# Patient Record
Sex: Male | Born: 1945 | Race: White | Hispanic: No | Marital: Married | Smoking: Never smoker
Health system: Southern US, Community
[De-identification: ages and names within clinical notes are randomized; demographics above are authoritative.]

## PROBLEM LIST (undated history)

## (undated) DIAGNOSIS — I82409 Acute embolism and thrombosis of unspecified deep veins of unspecified lower extremity: Secondary | ICD-10-CM

## (undated) DIAGNOSIS — D6869 Other thrombophilia: Secondary | ICD-10-CM

## (undated) DIAGNOSIS — I10 Essential (primary) hypertension: Secondary | ICD-10-CM

## (undated) DIAGNOSIS — J40 Bronchitis, not specified as acute or chronic: Secondary | ICD-10-CM

## (undated) DIAGNOSIS — G629 Polyneuropathy, unspecified: Secondary | ICD-10-CM

## (undated) DIAGNOSIS — I319 Disease of pericardium, unspecified: Secondary | ICD-10-CM

## (undated) DIAGNOSIS — R600 Localized edema: Secondary | ICD-10-CM

## (undated) DIAGNOSIS — G459 Transient cerebral ischemic attack, unspecified: Secondary | ICD-10-CM

## (undated) DIAGNOSIS — K259 Gastric ulcer, unspecified as acute or chronic, without hemorrhage or perforation: Secondary | ICD-10-CM

## (undated) DIAGNOSIS — E119 Type 2 diabetes mellitus without complications: Secondary | ICD-10-CM

## (undated) DIAGNOSIS — I7121 Aneurysm of the ascending aorta, without rupture: Secondary | ICD-10-CM

## (undated) DIAGNOSIS — I639 Cerebral infarction, unspecified: Secondary | ICD-10-CM

## (undated) DIAGNOSIS — K219 Gastro-esophageal reflux disease without esophagitis: Secondary | ICD-10-CM

## (undated) DIAGNOSIS — H9190 Unspecified hearing loss, unspecified ear: Secondary | ICD-10-CM

## (undated) DIAGNOSIS — E785 Hyperlipidemia, unspecified: Secondary | ICD-10-CM

## (undated) DIAGNOSIS — Z8719 Personal history of other diseases of the digestive system: Secondary | ICD-10-CM

## (undated) DIAGNOSIS — N400 Enlarged prostate without lower urinary tract symptoms: Secondary | ICD-10-CM

## (undated) HISTORY — PX: OTHER SURGICAL HISTORY: SHX169

## (undated) HISTORY — PX: KNEE ARTHROSCOPY: SHX127

## (undated) HISTORY — PX: CARDIAC CATHETERIZATION: SHX172

---

## 1987-08-30 DIAGNOSIS — I319 Disease of pericardium, unspecified: Secondary | ICD-10-CM

## 1987-08-30 HISTORY — DX: Disease of pericardium, unspecified: I31.9

## 2016-06-22 ENCOUNTER — Encounter: Payer: Self-pay | Admitting: *Deleted

## 2016-06-28 ENCOUNTER — Ambulatory Visit: Payer: Medicare Other | Admitting: Certified Registered Nurse Anesthetist

## 2016-06-28 ENCOUNTER — Encounter
Admission: RE | Disposition: A | Discharge status: Home / Self Care | Payer: Self-pay | Source: Ambulatory Visit | Attending: Ophthalmology

## 2016-06-28 ENCOUNTER — Ambulatory Visit
Admission: RE | Admit: 2016-06-28 | Discharge: 2016-06-28 | Disposition: A | Discharge status: Home / Self Care | Payer: Medicare Other | Source: Ambulatory Visit | Attending: Ophthalmology | Admitting: Ophthalmology

## 2016-06-28 DIAGNOSIS — E7800 Pure hypercholesterolemia, unspecified: Secondary | ICD-10-CM | POA: Insufficient documentation

## 2016-06-28 DIAGNOSIS — E1136 Type 2 diabetes mellitus with diabetic cataract: Secondary | ICD-10-CM | POA: Insufficient documentation

## 2016-06-28 DIAGNOSIS — Z79899 Other long term (current) drug therapy: Secondary | ICD-10-CM | POA: Diagnosis not present

## 2016-06-28 DIAGNOSIS — Z8673 Personal history of transient ischemic attack (TIA), and cerebral infarction without residual deficits: Secondary | ICD-10-CM | POA: Insufficient documentation

## 2016-06-28 DIAGNOSIS — K219 Gastro-esophageal reflux disease without esophagitis: Secondary | ICD-10-CM | POA: Insufficient documentation

## 2016-06-28 DIAGNOSIS — I10 Essential (primary) hypertension: Secondary | ICD-10-CM | POA: Insufficient documentation

## 2016-06-28 HISTORY — DX: Essential (primary) hypertension: I10

## 2016-06-28 HISTORY — PX: CATARACT EXTRACTION W/PHACO: SHX586

## 2016-06-28 HISTORY — DX: Cerebral infarction, unspecified: I63.9

## 2016-06-28 HISTORY — DX: Acute embolism and thrombosis of unspecified deep veins of unspecified lower extremity: I82.409

## 2016-06-28 HISTORY — DX: Type 2 diabetes mellitus without complications: E11.9

## 2016-06-28 HISTORY — DX: Unspecified hearing loss, unspecified ear: H91.90

## 2016-06-28 HISTORY — DX: Polyneuropathy, unspecified: G62.9

## 2016-06-28 HISTORY — DX: Benign prostatic hyperplasia without lower urinary tract symptoms: N40.0

## 2016-06-28 HISTORY — DX: Disease of pericardium, unspecified: I31.9

## 2016-06-28 HISTORY — DX: Gastro-esophageal reflux disease without esophagitis: K21.9

## 2016-06-28 LAB — GLUCOSE, CAPILLARY: Glucose-Capillary: 107 mg/dL — ABNORMAL HIGH (ref 65–99)

## 2016-06-28 SURGERY — PHACOEMULSIFICATION, CATARACT, WITH IOL INSERTION
Anesthesia: Monitor Anesthesia Care | Site: Eye | Laterality: Right | Wound class: Clean

## 2016-06-28 MED ORDER — NA CHONDROIT SULF-NA HYALURON 40-17 MG/ML IO SOLN
INTRAOCULAR | Status: DC | PRN
  Administered 2016-06-28: 1 mL via INTRAOCULAR

## 2016-06-28 MED ORDER — LIDOCAINE HCL (PF) 4 % IJ SOLN
INTRAMUSCULAR | Status: AC
  Filled 2016-06-28: qty 5

## 2016-06-28 MED ORDER — NA CHONDROIT SULF-NA HYALURON 40-17 MG/ML IO SOLN
INTRAOCULAR | Status: AC
  Filled 2016-06-28: qty 1

## 2016-06-28 MED ORDER — ARMC OPHTHALMIC DILATING DROPS
1.0000 "application " | OPHTHALMIC | Status: AC
  Administered 2016-06-28 (×3): 1 via OPHTHALMIC

## 2016-06-28 MED ORDER — POVIDONE-IODINE 5 % OP SOLN
OPHTHALMIC | Status: AC
  Filled 2016-06-28: qty 30

## 2016-06-28 MED ORDER — EPINEPHRINE PF 1 MG/ML IJ SOLN
INTRAMUSCULAR | Status: DC | PRN
  Administered 2016-06-28: 1 mL via OPHTHALMIC

## 2016-06-28 MED ORDER — ARMC OPHTHALMIC DILATING DROPS
OPHTHALMIC | Status: AC
  Administered 2016-06-28: 1 via OPHTHALMIC
  Filled 2016-06-28: qty 0.4

## 2016-06-28 MED ORDER — POVIDONE-IODINE 5 % OP SOLN
OPHTHALMIC | Status: DC | PRN
  Administered 2016-06-28: 1 via OPHTHALMIC

## 2016-06-28 MED ORDER — MOXIFLOXACIN HCL 0.5 % OP SOLN
OPHTHALMIC | Status: DC | PRN
  Administered 2016-06-28: 1 [drp] via OPHTHALMIC

## 2016-06-28 MED ORDER — MIDAZOLAM HCL 2 MG/2ML IJ SOLN
INTRAMUSCULAR | Status: DC | PRN
  Administered 2016-06-28: 1 mg via INTRAVENOUS

## 2016-06-28 MED ORDER — LIDOCAINE HCL (PF) 4 % IJ SOLN
INTRAOCULAR | Status: DC | PRN
  Administered 2016-06-28: 4 mL via OPHTHALMIC

## 2016-06-28 MED ORDER — SODIUM CHLORIDE 0.9 % IV SOLN
INTRAVENOUS | Status: DC
  Administered 2016-06-28: 09:00:00 via INTRAVENOUS

## 2016-06-28 MED ORDER — EPINEPHRINE PF 1 MG/ML IJ SOLN
INTRAMUSCULAR | Status: AC
  Filled 2016-06-28: qty 2

## 2016-06-28 MED ORDER — MOXIFLOXACIN HCL 0.5 % OP SOLN
OPHTHALMIC | Status: AC
  Administered 2016-06-28: 1 [drp] via OPHTHALMIC
  Filled 2016-06-28: qty 3

## 2016-06-28 MED ORDER — CARBACHOL 0.01 % IO SOLN
INTRAOCULAR | Status: DC | PRN
  Administered 2016-06-28: 0.5 mL via INTRAOCULAR

## 2016-06-28 MED ORDER — MOXIFLOXACIN HCL 0.5 % OP SOLN
1.0000 [drp] | OPHTHALMIC | Status: AC
  Administered 2016-06-28 (×3): 1 [drp] via OPHTHALMIC

## 2016-06-28 SURGICAL SUPPLY — 21 items
CANNULA ANT/CHMB 27GA (MISCELLANEOUS) ×3 IMPLANT
CUP MEDICINE 2OZ PLAST GRAD ST (MISCELLANEOUS) ×3 IMPLANT
GLOVE BIO SURGEON STRL SZ8 (GLOVE) ×3 IMPLANT
GLOVE BIOGEL M 6.5 STRL (GLOVE) ×3 IMPLANT
GLOVE SURG LX 8.0 MICRO (GLOVE) ×2
GLOVE SURG LX STRL 8.0 MICRO (GLOVE) ×1 IMPLANT
GOWN STRL REUS W/ TWL LRG LVL3 (GOWN DISPOSABLE) ×2 IMPLANT
GOWN STRL REUS W/TWL LRG LVL3 (GOWN DISPOSABLE) ×4
LENS IOL TECNIS ITEC 18.0 (Intraocular Lens) ×3 IMPLANT
PACK CATARACT (MISCELLANEOUS) ×3 IMPLANT
PACK CATARACT BRASINGTON LX (MISCELLANEOUS) ×3 IMPLANT
PACK EYE AFTER SURG (MISCELLANEOUS) ×3 IMPLANT
SOL BSS BAG (MISCELLANEOUS) ×3
SOL PREP PVP 2OZ (MISCELLANEOUS) ×3
SOLUTION BSS BAG (MISCELLANEOUS) ×1 IMPLANT
SOLUTION PREP PVP 2OZ (MISCELLANEOUS) ×1 IMPLANT
SYR 3ML LL SCALE MARK (SYRINGE) ×3 IMPLANT
SYR 5ML LL (SYRINGE) ×3 IMPLANT
SYR TB 1ML 27GX1/2 LL (SYRINGE) ×3 IMPLANT
WATER STERILE IRR 250ML POUR (IV SOLUTION) ×3 IMPLANT
WIPE NON LINTING 3.25X3.25 (MISCELLANEOUS) ×3 IMPLANT

## 2016-06-28 NOTE — Transfer of Care (Signed)
Immediate Anesthesia Transfer of Care Note  Patient: Yechiel Myklebust  Procedure(s) Performed: Procedure(s) with comments: CATARACT EXTRACTION PHACO AND INTRAOCULAR LENS PLACEMENT (IOC) (Right) - [place] 1.02 AP% 21.7 CDE 13.51 Fluid pack lot # [id number]  Patient Location: PACU  Anesthesia Type:MAC  Level of Consciousness: awake, alert  and oriented  Airway & Oxygen Therapy: Patient Spontanous Breathing  Post-op Assessment: Report given to RN and Post -op Vital signs reviewed and stable  Post vital signs: Reviewed and stable  Last Vitals:  Vitals:   06/28/16 0817  BP: (!) 153/96  Pulse: 66  Resp: 16  Temp: (!) 35.9 C    Last Pain:  Vitals:   06/28/16 0817  TempSrc: Tympanic         Complications: No apparent anesthesia complications

## 2016-06-28 NOTE — Anesthesia Postprocedure Evaluation (Signed)
Anesthesia Post Note  Patient: Ronald Duarte  Procedure(s) Performed: Procedure(s) (LRB): CATARACT EXTRACTION PHACO AND INTRAOCULAR LENS PLACEMENT (IOC) (Right)  Patient location during evaluation: PACU Anesthesia Type: MAC Level of consciousness: oriented and awake and alert Pain management: satisfactory to patient Vital Signs Assessment: post-procedure vital signs reviewed and stable Respiratory status: respiratory function stable Cardiovascular status: stable Anesthetic complications: no    Last Vitals:  Vitals:   06/28/16 0817 06/28/16 0944  BP: (!) 153/96 136/84  Pulse: 66 60  Resp: 16 16  Temp: (!) 35.9 C 36.5 C    Last Pain:  Vitals:   06/28/16 0944  TempSrc: Temporal                 Blima Singer

## 2016-06-28 NOTE — Anesthesia Procedure Notes (Signed)
Procedure Name: MAC Performed by: Milen Lengacher Pre-anesthesia Checklist: Patient identified, Emergency Drugs available, Suction available, Patient being monitored and Timeout performed Oxygen Delivery Method: Nasal cannula       

## 2016-06-28 NOTE — Anesthesia Procedure Notes (Deleted)
Anesthesia Regional Block: Narrative:       

## 2016-06-28 NOTE — Op Note (Signed)
PREOPERATIVE DIAGNOSIS:  Nuclear sclerotic cataract of the right eye.   POSTOPERATIVE DIAGNOSIS:  NUCLEAR SCLEROTIC CATARACT RIGHT EYE   OPERATIVE PROCEDURE: Procedure(s): CATARACT EXTRACTION PHACO AND INTRAOCULAR LENS PLACEMENT (IOC)   SURGEON:  Birder Robson, MD.   ANESTHESIA:  No anesthesia staff entered.  1.      Managed anesthesia care. 2.      0.80ml of Shugarcaine was instilled in the eye following the paracentesis.   COMPLICATIONS:  None.   TECHNIQUE:   Stop and chop   DESCRIPTION OF PROCEDURE:  The patient was examined and consented in the preoperative holding area where the aforementioned topical anesthesia was applied to the right eye and then brought back to the Operating Room where the right eye was prepped and draped in the usual sterile ophthalmic fashion and a lid speculum was placed. A paracentesis was created with the side port blade and the anterior chamber was filled with viscoelastic. A near clear corneal incision was performed with the steel keratome. A continuous curvilinear capsulorrhexis was performed with a cystotome followed by the capsulorrhexis forceps. Hydrodissection and hydrodelineation were carried out with BSS on a blunt cannula. The lens was removed in a stop and chop  technique and the remaining cortical material was removed with the irrigation-aspiration handpiece. The capsular bag was inflated with viscoelastic and the Technis [id number]  lens was placed in the capsular bag without complication. The remaining viscoelastic was removed from the eye with the irrigation-aspiration handpiece. The wounds were hydrated. The anterior chamber was flushed with Miostat and the eye was inflated to physiologic pressure. 0.23ml of Vigamox was placed in the anterior chamber. The wounds were found to be water tight. The eye was dressed with Vigamox. The patient was given protective glasses to wear throughout the day and a shield with which to sleep tonight. The patient was also  given drops with which to begin a drop regimen today and will follow-up with me in one day.  Implant Name Type Inv. Item Serial No. Manufacturer Lot No. LRB No. Used  LENS IOL DIOP 18.0 - [id number] Intraocular Lens LENS IOL DIOP 18.0 [id number]   Right 1   Procedure(s) with comments: CATARACT EXTRACTION PHACO AND INTRAOCULAR LENS PLACEMENT (IOC) (Right) - [place] 1.02 AP% 21.7 CDE 13.51 Fluid pack lot # [id number]  Electronically signed: [place] 06/28/2016 9:42 AM

## 2016-06-28 NOTE — Anesthesia Preprocedure Evaluation (Signed)
Anesthesia Evaluation  Patient identified by MRN, date of birth, ID band Patient awake    Reviewed: Allergy & Precautions, H&P , NPO status , Patient's Chart, lab work & pertinent test results, reviewed documented beta blocker date and time   Airway Mallampati: II  TM Distance: >3 FB Neck ROM: full    Dental no notable dental hx. (+) Teeth Intact   Pulmonary neg pulmonary ROS,    Pulmonary exam normal breath sounds clear to auscultation       Cardiovascular Exercise Tolerance: Good hypertension, negative cardio ROS   Rhythm:regular Rate:Normal     Neuro/Psych CVA, No Residual Symptoms negative neurological ROS  negative psych ROS   GI/Hepatic negative GI ROS, Neg liver ROS, GERD  Medicated,  Endo/Other  negative endocrine ROSdiabetes  Renal/GU      Musculoskeletal   Abdominal   Peds  Hematology negative hematology ROS (+)   Anesthesia Other Findings   Reproductive/Obstetrics negative OB ROS                             Anesthesia Physical Anesthesia Plan  ASA: III  Anesthesia Plan: MAC   Post-op Pain Management:    Induction:   Airway Management Planned:   Additional Equipment:   Intra-op Plan:   Post-operative Plan:   Informed Consent: I have reviewed the patients History and Physical, chart, labs and discussed the procedure including the risks, benefits and alternatives for the proposed anesthesia with the patient or authorized representative who has indicated his/her understanding and acceptance.     Plan Discussed with: CRNA  Anesthesia Plan Comments:         Anesthesia Quick Evaluation

## 2016-06-28 NOTE — Discharge Instructions (Signed)
Eye Surgery Discharge Instructions  Expect mild scratchy sensation or mild soreness. DO NOT RUB YOUR EYE!  The day of surgery:  Minimal physical activity, but bed rest is not required  No reading, computer work, or close hand work  No bending, lifting, or straining.  May watch TV  For 24 hours:  No driving, legal decisions, or alcoholic beverages  Safety precautions  Eat anything you prefer: It is better to start with liquids, then soup then solid foods.  _____ Eye patch should be worn until postoperative exam tomorrow.  ____ Solar shield eyeglasses should be worn for comfort in the sunlight/patch while sleeping  Resume all regular medications including aspirin or Coumadin if these were discontinued prior to surgery. You may shower, bathe, shave, or wash your hair. Tylenol may be taken for mild discomfort.  Call your doctor if you experience significant pain, nausea, or vomiting, fever > 101 or other signs of infection. [phone] or [phone] Specific instructions:  Follow-up Information    Tim Lair, MD .   Specialty:  Ophthalmology Why:  November 1 at 9:40am Contact information: [address] [place] [address] [phone]

## 2016-06-28 NOTE — H&P (Signed)
All labs reviewed. Abnormal studies sent to patients PCP when indicated.  Previous H&P reviewed, patient examined, there are NO CHANGES.  Ronald Freiermuth LOUIS10/31/20179:17 AM

## 2016-08-23 DIAGNOSIS — E119 Type 2 diabetes mellitus without complications: Secondary | ICD-10-CM | POA: Insufficient documentation

## 2016-08-23 DIAGNOSIS — K219 Gastro-esophageal reflux disease without esophagitis: Secondary | ICD-10-CM | POA: Insufficient documentation

## 2016-08-23 DIAGNOSIS — Z86718 Personal history of other venous thrombosis and embolism: Secondary | ICD-10-CM | POA: Insufficient documentation

## 2016-08-23 DIAGNOSIS — Z8679 Personal history of other diseases of the circulatory system: Secondary | ICD-10-CM | POA: Insufficient documentation

## 2016-08-23 DIAGNOSIS — R399 Unspecified symptoms and signs involving the genitourinary system: Secondary | ICD-10-CM | POA: Insufficient documentation

## 2016-08-23 DIAGNOSIS — I2510 Atherosclerotic heart disease of native coronary artery without angina pectoris: Secondary | ICD-10-CM | POA: Insufficient documentation

## 2016-08-23 DIAGNOSIS — E7849 Other hyperlipidemia: Secondary | ICD-10-CM | POA: Insufficient documentation

## 2016-08-23 DIAGNOSIS — I10 Essential (primary) hypertension: Secondary | ICD-10-CM | POA: Insufficient documentation

## 2016-09-01 DIAGNOSIS — L03032 Cellulitis of left toe: Secondary | ICD-10-CM

## 2016-09-01 DIAGNOSIS — L02612 Cutaneous abscess of left foot: Secondary | ICD-10-CM | POA: Insufficient documentation

## 2016-09-16 ENCOUNTER — Other Ambulatory Visit: Payer: Self-pay | Admitting: Otolaryngology

## 2016-09-16 DIAGNOSIS — H903 Sensorineural hearing loss, bilateral: Secondary | ICD-10-CM

## 2016-09-20 ENCOUNTER — Encounter: Payer: Self-pay | Admitting: Urology

## 2016-09-20 ENCOUNTER — Ambulatory Visit: Payer: Self-pay | Admitting: Urology

## 2016-09-22 ENCOUNTER — Encounter: Payer: Self-pay | Admitting: Urology

## 2016-09-23 ENCOUNTER — Other Ambulatory Visit: Payer: Self-pay | Admitting: Otolaryngology

## 2016-09-23 ENCOUNTER — Ambulatory Visit
Admission: RE | Admit: 2016-09-23 | Discharge: 2016-09-23 | Disposition: A | Discharge status: Home / Self Care | Payer: Medicare Other | Source: Ambulatory Visit | Attending: Otolaryngology | Admitting: Otolaryngology

## 2016-09-23 DIAGNOSIS — Z1389 Encounter for screening for other disorder: Secondary | ICD-10-CM

## 2016-09-23 DIAGNOSIS — Z0189 Encounter for other specified special examinations: Secondary | ICD-10-CM

## 2016-09-23 DIAGNOSIS — H903 Sensorineural hearing loss, bilateral: Secondary | ICD-10-CM

## 2016-09-23 DIAGNOSIS — I700 Atherosclerosis of aorta: Secondary | ICD-10-CM | POA: Insufficient documentation

## 2016-09-23 MED ORDER — GADOBENATE DIMEGLUMINE 529 MG/ML IV SOLN
20.0000 mL | Freq: Once | INTRAVENOUS | Status: AC | PRN
  Administered 2016-09-23: 20 mL via INTRAVENOUS

## 2016-10-05 ENCOUNTER — Ambulatory Visit: Admission: RE | Admit: 2016-10-05 | Payer: Medicare Other | Source: Ambulatory Visit

## 2016-10-17 ENCOUNTER — Ambulatory Visit: Payer: Medicare Other

## 2016-10-25 ENCOUNTER — Ambulatory Visit (INDEPENDENT_AMBULATORY_CARE_PROVIDER_SITE_OTHER): Payer: Medicare Other | Admitting: Urology

## 2016-10-25 ENCOUNTER — Encounter: Payer: Self-pay | Admitting: Urology

## 2016-10-25 VITALS — BP 145/88 | HR 83 | Ht 70.0 in | Wt 247.4 lb

## 2016-10-25 DIAGNOSIS — R350 Frequency of micturition: Secondary | ICD-10-CM | POA: Diagnosis not present

## 2016-10-25 LAB — URINALYSIS, COMPLETE
Bilirubin, UA: NEGATIVE
KETONES UA: NEGATIVE
Leukocytes, UA: NEGATIVE
NITRITE UA: NEGATIVE
Protein, UA: NEGATIVE
RBC UA: NEGATIVE
SPEC GRAV UA: 1.02 (ref 1.005–1.030)
UUROB: 0.2 mg/dL (ref 0.2–1.0)
pH, UA: 6 (ref 5.0–7.5)

## 2016-10-25 LAB — MICROSCOPIC EXAMINATION
Bacteria, UA: NONE SEEN
RBC MICROSCOPIC, UA: NONE SEEN /HPF (ref 0–?)
WBC UA: NONE SEEN /HPF (ref 0–?)

## 2016-10-25 LAB — BLADDER SCAN AMB NON-IMAGING: Scan Result: 47

## 2016-10-25 NOTE — Progress Notes (Signed)
10/25/2016 3:18 PM   [place] Bl[place] 12-26-1945 [id number]  Referring provider: Adin Hector, MD [facility] [place], [place] [address]  Chief Complaint  Patient presents with  . Urinary Frequency    HPI: 71 year old male who presents today for further evaluation of urinary frequency. The patient has had urinary tract symptoms for years. He was started on finasteride 15 or 20 years ago. He's been stable on this and subsequently added Flomax. He states that he has a strong stream, he feels that he empties his bladder completely. He has no history of urinary tract infections or hematuria. However, the patient does void frequently, every several hours. He gets up 4 times average at night.  The patient has never had an abnormal rectal exam. His PSAs have always been normal. He has no history of kidney stones. He has no family history of prostate cancer.     PMH: Past Medical History:  Diagnosis Date  . BPH (benign prostatic hyperplasia)   . Diabetes mellitus without complication ([place])   . DVT (deep venous thrombosis) ([place])    H/O LEFT LEG  . GERD (gastroesophageal reflux disease)   . HOH (hard of hearing)   . Hypertension   . 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 Pericarditis 1989  . Stroke [facility])    TIA 1987    Surgical History: Past Surgical History:  Procedure Laterality Date  . CARDIAC CATHETERIZATION     X 2  . CATARACT EXTRACTION W/PHACO Right 06/28/2016   Procedure: CATARACT EXTRACTION PHACO AND INTRAOCULAR LENS PLACEMENT (IOC);  Surgeon: Birder Robson, MD;  Location: [facility] ORS;  Service: Ophthalmology;  Laterality: Right;  [place] 1.02 AP% 21.7 CDE 13.51 Fluid pack lot # [id number]  . KNEE ARTHROSCOPY  2014/2016    Home Medications:  Allergies as of 10/25/2016   No Known Allergies     Medication List       Accurate as of 10/25/16  3:18 PM. Always use your most recent med list.          Biotin 10000 MCG Tabs Take 10,000  mcg by mouth daily with supper.   cetirizine 10 MG tablet Commonly known as:  ZYRTEC Take 10 mg by mouth at bedtime.   CoQ10 200 MG Caps Take 200 mg by mouth daily with supper.   DUREZOL 0.05 % Emul Generic drug:  Difluprednate Place 1 drop into the right eye 2 (two) times daily.   dutasteride 0.5 MG capsule Commonly known as:  AVODART Take 0.5 mg by mouth at bedtime.   ezetimibe 10 MG tablet Commonly known as:  ZETIA Take 10 mg by mouth at bedtime.   folic acid A999333 MCG tablet Commonly known as:  FOLVITE Take 400 mcg by mouth daily with supper.   ibuprofen 200 MG tablet Commonly known as:  ADVIL,MOTRIN Take 200 mg by mouth every 8 (eight) hours as needed (for pain (seldom)).   ILEVRO 0.3 % ophthalmic suspension Generic drug:  nepafenac Place 1 drop into the right eye daily.   metFORMIN 500 MG tablet Commonly known as:  GLUCOPHAGE Take 500 mg by mouth 3 (three) times daily with meals.   nebivolol 2.5 MG tablet Commonly known as:  BYSTOLIC Take 2.5 mg by mouth at bedtime.   ranitidine 150 MG tablet Commonly known as:  ZANTAC Take 150 mg by mouth at bedtime.   rivaroxaban 20 MG Tabs tablet Commonly known as:  XARELTO Take 20 mg by  mouth daily after supper.   rosuvastatin 20 MG tablet Commonly known as:  CRESTOR Take 20 mg by mouth at bedtime.   tamsulosin 0.4 MG Caps capsule Commonly known as:  FLOMAX Take 0.4 mg by mouth at bedtime.       Allergies: No Known Allergies  Family History: Family History  Problem Relation Age of Onset  . Prostate cancer Neg Hx   . Hematuria Neg Hx   . Sickle cell anemia Neg Hx   . Tuberculosis Neg Hx   . Bladder Cancer Neg Hx   . Kidney cancer Neg Hx     Social History:  reports that he has never smoked. He has never used smokeless tobacco. He reports that he drinks alcohol. He reports that he does not use drugs.  ROS: UROLOGY Frequent Urination?: Yes Hard to postpone urination?: Yes Burning/pain with  urination?: No Get up at night to urinate?: Yes Leakage of urine?: Yes Urine stream starts and stops?: No Trouble starting stream?: No Do you have to strain to urinate?: No Blood in urine?: No Urinary tract infection?: No Sexually transmitted disease?: No Injury to kidneys or bladder?: No Painful intercourse?: No Weak stream?: No Erection problems?: Yes Penile pain?: No  Gastrointestinal Nausea?: No Vomiting?: No Indigestion/heartburn?: No Diarrhea?: No Constipation?: No  Constitutional Fever: No Night sweats?: No Weight loss?: No Fatigue?: No  Skin Skin rash/lesions?: No Itching?: No  Eyes Blurred vision?: No Double vision?: No  Ears/Nose/Throat Sore throat?: No Sinus problems?: No  Hematologic/Lymphatic Swollen glands?: No Easy bruising?: No  Cardiovascular Leg swelling?: Yes Chest pain?: No  Respiratory Cough?: No Shortness of breath?: No  Endocrine Excessive thirst?: No  Musculoskeletal Back pain?: No Joint pain?: No  Neurological Headaches?: No Dizziness?: No  Psychologic Depression?: No Anxiety?: No  Physical Exam: BP (!) 145/88 (BP Location: Left Arm, Patient Position: Sitting, Cuff Size: Large)   Pulse 83   Ht 5\' 10"  (1.778 m)   Wt 112.2 kg (247 lb 6.4 oz)   BMI 35.50 kg/m   Constitutional:  Alert and oriented, No acute distress. HEENT: [place] AT, moist mucus membranes.  Trachea midline, no masses. Cardiovascular: No clubbing, cyanosis, or edema. Respiratory: Normal respiratory effort, no increased work of breathing. GI: Abdomen is soft, nontender, nondistended, no abdominal masses GU: No CVA tenderness. DRE: The patient's prostate is 15-20 g. Symmetric, no nodules. Skin: No rashes, bruises or suspicious lesions. Lymph: No cervical or inguinal adenopathy. Neurologic: Grossly intact, no focal deficits, moving all 4 extremities. Psychiatric: Normal mood and affect.  Laboratory Data: No results found for: WBC, HGB, HCT, MCV,  PLT  No results found for: CREATININE  No results found for: PSA  No results found for: TESTOSTERONE  No results found for: HGBA1C  Urinalysis No results found for: COLORURINE, APPEARANCEUR, LABSPEC, PHURINE, GLUCOSEU, HGBUR, BILIRUBINUR, KETONESUR, PROTEINUR, UROBILINOGEN, NITRITE, LEUKOCYTESUR  Pertinent Imaging: Postvoid residual: 45 mL  Assessment & Plan:  The patient has an overactive bladder. He appears to be emptying his bladder completely and has a strong stream.  1. Urinary frequency Our plan is to try myrbetriq for 1 month. We discussed the side effects of myrbetriq in detail. We'll plan to have him come back in 1 month after trialing a sample of myrbetriq 50 mg daily. We briefly discussed his other BPH medications, we may trial stopping tamsulosin as he does not perceive that this has been a significant benefit for him. - Urinalysis, Complete - Bladder Scan (Post Void Residual) in office  Return in about 4 weeks (around 11/22/2016).  Ardis Hughs, [facility] [address] [place], [place] [address] [phone]

## 2016-11-22 ENCOUNTER — Encounter: Payer: Self-pay | Admitting: Urology

## 2016-11-22 ENCOUNTER — Ambulatory Visit (INDEPENDENT_AMBULATORY_CARE_PROVIDER_SITE_OTHER): Payer: Medicare Other | Admitting: Urology

## 2016-11-22 VITALS — BP 136/76 | HR 82 | Ht 70.0 in | Wt 242.6 lb

## 2016-11-22 DIAGNOSIS — R350 Frequency of micturition: Secondary | ICD-10-CM

## 2016-11-22 LAB — BLADDER SCAN AMB NON-IMAGING: Scan Result: 0

## 2016-11-22 NOTE — Progress Notes (Signed)
11/22/2016 11:28 AM   [place] Blas 1946/07/28 [id number]  Referring provider: Adin Hector, MD [facility] [place], [place] [address]  Chief Complaint  Patient presents with  . Follow-up    urinary frequency    HPI: 71 year old male who presents today for further evaluation of urinary frequency. The patient has had urinary tract symptoms for years. He was started on finasteride 15 or 20 years ago. He's been stable on this and subsequently added Flomax. He states that he has a strong stream, he feels that he empties his bladder completely. He has no history of urinary tract infections or hematuria. However, the patient does void frequently, every several hours. He gets up 4 times average at night.  The patient has never had an abnormal rectal exam. His PSAs have always been normal. He has no history of kidney stones. He has no family history of prostate cancer.    Interval: The patient presents today for follow-up of his overactive bladder symptoms. His last vstarted under better 50 mg daily after was noted that he was entering his bladder effectively. Since the patient was last seen he has been treated for bronchitis with a steroid burst as well as an antitussive medication. He tells me that his symptoms actually were unchanged on myrbetriq until he started the treatment for his bronchitis. At that point, his symptoms improved significantly. Currently, he is only getting up once per night.  He continues to have some urinary frequency and urgency.  I PSS:  , Q OL4     PMH: Past Medical History:  Diagnosis Date  . BPH (benign prostatic hyperplasia)   . Diabetes mellitus without complication ([place])   . DVT (deep venous thrombosis) ([place])    H/O LEFT LEG  . GERD (gastroesophageal reflux disease)   . HOH (hard of hearing)   . Hypertension   . 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 Pericarditis 1989  . Stroke [facility])    TIA 1987    Surgical  History: Past Surgical History:  Procedure Laterality Date  . CARDIAC CATHETERIZATION     X 2  . CATARACT EXTRACTION W/PHACO Right 06/28/2016   Procedure: CATARACT EXTRACTION PHACO AND INTRAOCULAR LENS PLACEMENT (IOC);  Surgeon: Birder Robson, MD;  Location: [facility] ORS;  Service: Ophthalmology;  Laterality: Right;  [place] 1.02 AP% 21.7 CDE 13.51 Fluid pack lot # [id number]  . KNEE ARTHROSCOPY  2014/2016    Home Medications:  Allergies as of 11/22/2016   No Known Allergies     Medication List       Accurate as of 11/22/16 11:28 AM. Always use your most recent med list.          benzonatate 200 MG capsule Commonly known as:  TESSALON Take by mouth.   Biotin 10000 MCG Tabs Take 10,000 mcg by mouth daily with supper.   cetirizine 10 MG tablet Commonly known as:  ZYRTEC Take 10 mg by mouth at bedtime.   CoQ10 200 MG Caps Take 200 mg by mouth daily with supper.   DUREZOL 0.05 % Emul Generic drug:  Difluprednate Place 1 drop into the right eye 2 (two) times daily.   dutasteride 0.5 MG capsule Commonly known as:  AVODART Take 0.5 mg by mouth at bedtime.   ezetimibe 10 MG tablet Commonly known as:  ZETIA Take 10 mg by mouth at bedtime.   folic acid 332 MCG tablet Commonly known as:  FOLVITE Take 400  mcg by mouth daily with supper.   ibuprofen 200 MG tablet Commonly known as:  ADVIL,MOTRIN Take 200 mg by mouth every 8 (eight) hours as needed (for pain (seldom)).   ILEVRO 0.3 % ophthalmic suspension Generic drug:  nepafenac Place 1 drop into the right eye daily.   metFORMIN 500 MG tablet Commonly known as:  GLUCOPHAGE Take 500 mg by mouth 3 (three) times daily with meals.   nebivolol 2.5 MG tablet Commonly known as:  BYSTOLIC Take 2.5 mg by mouth at bedtime.   ranitidine 150 MG tablet Commonly known as:  ZANTAC Take 150 mg by mouth at bedtime.   rivaroxaban 20 MG Tabs tablet Commonly known as:  XARELTO Take 20 mg by mouth daily after supper.     rosuvastatin 20 MG tablet Commonly known as:  CRESTOR Take 20 mg by mouth at bedtime.   tamsulosin 0.4 MG Caps capsule Commonly known as:  FLOMAX Take 0.4 mg by mouth at bedtime.       Allergies: No Known Allergies  Family History: Family History  Problem Relation Age of Onset  . Prostate cancer Neg Hx   . Hematuria Neg Hx   . Sickle cell anemia Neg Hx   . Tuberculosis Neg Hx   . Bladder Cancer Neg Hx   . Kidney cancer Neg Hx     Social History:  reports that he has never smoked. He has never used smokeless tobacco. He reports that he drinks alcohol. He reports that he does not use drugs.  ROS: UROLOGY Frequent Urination?: Yes Hard to postpone urination?: Yes Burning/pain with urination?: No Get up at night to urinate?: Yes Leakage of urine?: Yes Urine stream starts and stops?: No Trouble starting stream?: No Do you have to strain to urinate?: No Blood in urine?: No Urinary tract infection?: No Sexually transmitted disease?: No Injury to kidneys or bladder?: No Painful intercourse?: No Weak stream?: No Erection problems?: No Penile pain?: No  Gastrointestinal Nausea?: No Vomiting?: No Indigestion/heartburn?: No Diarrhea?: No Constipation?: No  Constitutional Fever: No Night sweats?: No Weight loss?: No Fatigue?: No  Skin Skin rash/lesions?: No Itching?: No  Eyes Blurred vision?: No Double vision?: No  Ears/Nose/Throat Sore throat?: No Sinus problems?: No  Hematologic/Lymphatic Swollen glands?: No Easy bruising?: No  Cardiovascular Leg swelling?: No Chest pain?: No  Respiratory Cough?: Yes Shortness of breath?: No  Endocrine Excessive thirst?: No  Musculoskeletal Back pain?: No Joint pain?: No  Neurological Headaches?: No Dizziness?: No  Psychologic Depression?: No Anxiety?: No  Physical Exam: BP 136/76   Pulse 82   Ht 5\' 10"  (1.778 m)   Wt 110 kg (242 lb 9.6 oz)   BMI 34.81 kg/m   Constitutional:  Alert and  oriented, No acute distress. HEENT: [place] AT, moist mucus membranes.  Trachea midline, no masses. Cardiovascular: No clubbing, cyanosis, or edema. Respiratory: Normal respiratory effort, no increased work of breathing. GI: Abdomen is soft, nontender, nondistended, no abdominal masses GU: No CVA tenderness. DRE:  Performed on 09/2016:The patient's prostate is 15-20 g. Symmetric, no nodules. Skin: No rashes, bruises or suspicious lesions. Lymph: No cervical or inguinal adenopathy. Neurologic: Grossly intact, no focal deficits, moving all 4 extremities. Psychiatric: Normal mood and affect.  Laboratory Data: No results found for: WBC, HGB, HCT, MCV, PLT  No results found for: CREATININE  No results found for: PSA  No results found for: TESTOSTERONE  No results found for: HGBA1C  Urinalysis    Component Value Date/Time   APPEARANCEUR Clear  10/25/2016 1437   GLUCOSEU 1+ (A) 10/25/2016 1437   BILIRUBINUR Negative 10/25/2016 1437   PROTEINUR Negative 10/25/2016 1437   NITRITE Negative 10/25/2016 1437   LEUKOCYTESUR Negative 10/25/2016 1437    Pertinent Imaging: PVR: 0 mL  Assessment & Plan:  The patient has hadimprovement in his urinary tract symptoms since he was last seen. The question is whether this was a result of myrbetriq or the prednisone that he has been taking for his respiratory symptoms.  1. Urinary frequency Our plan is to continue with the current treatment, once he is off his steroids he will continue with myrbetriq for several weeks. Once his symptoms stabilized, he will stop the myrbetriq for a week to see if it makes any difference. I have refilled his samples for 1 month. We willgive him a prescription if he finds that the myrbetriq is actually making a difference. We will plan to follow-up with the patient in 1 year.  - Urinalysis, Complete - Bladder Scan (Post Void Residual) in office   Return in about 1 year (around 11/22/2017).  Ardis Hughs,  [facility] [address], [place], [place] [address] [phone]

## 2016-11-30 ENCOUNTER — Other Ambulatory Visit: Payer: Self-pay

## 2016-11-30 DIAGNOSIS — N3281 Overactive bladder: Secondary | ICD-10-CM

## 2016-11-30 MED ORDER — MIRABEGRON ER 50 MG PO TB24: 50.0000 mg | ORAL_TABLET | Freq: Every day | ORAL | 2 refills | Status: DC

## 2017-02-06 ENCOUNTER — Encounter: Payer: Self-pay | Admitting: *Deleted

## 2017-02-14 ENCOUNTER — Ambulatory Visit: Payer: Medicare Other | Admitting: Anesthesiology

## 2017-02-14 ENCOUNTER — Ambulatory Visit
Admission: RE | Admit: 2017-02-14 | Discharge: 2017-02-14 | Disposition: A | Discharge status: Home / Self Care | Payer: Medicare Other | Source: Ambulatory Visit | Attending: Ophthalmology | Admitting: Ophthalmology

## 2017-02-14 ENCOUNTER — Encounter
Admission: RE | Disposition: A | Discharge status: Home / Self Care | Payer: Self-pay | Source: Ambulatory Visit | Attending: Ophthalmology

## 2017-02-14 DIAGNOSIS — Z7984 Long term (current) use of oral hypoglycemic drugs: Secondary | ICD-10-CM | POA: Insufficient documentation

## 2017-02-14 DIAGNOSIS — J449 Chronic obstructive pulmonary disease, unspecified: Secondary | ICD-10-CM | POA: Insufficient documentation

## 2017-02-14 DIAGNOSIS — Z8673 Personal history of transient ischemic attack (TIA), and cerebral infarction without residual deficits: Secondary | ICD-10-CM | POA: Diagnosis not present

## 2017-02-14 DIAGNOSIS — Z86718 Personal history of other venous thrombosis and embolism: Secondary | ICD-10-CM | POA: Diagnosis not present

## 2017-02-14 DIAGNOSIS — I2510 Atherosclerotic heart disease of native coronary artery without angina pectoris: Secondary | ICD-10-CM | POA: Diagnosis not present

## 2017-02-14 DIAGNOSIS — Z8719 Personal history of other diseases of the digestive system: Secondary | ICD-10-CM | POA: Insufficient documentation

## 2017-02-14 DIAGNOSIS — G5791 Unspecified mononeuropathy of right lower limb: Secondary | ICD-10-CM | POA: Diagnosis not present

## 2017-02-14 DIAGNOSIS — E7800 Pure hypercholesterolemia, unspecified: Secondary | ICD-10-CM | POA: Insufficient documentation

## 2017-02-14 DIAGNOSIS — Z79899 Other long term (current) drug therapy: Secondary | ICD-10-CM | POA: Insufficient documentation

## 2017-02-14 DIAGNOSIS — I10 Essential (primary) hypertension: Secondary | ICD-10-CM | POA: Diagnosis not present

## 2017-02-14 DIAGNOSIS — E119 Type 2 diabetes mellitus without complications: Secondary | ICD-10-CM | POA: Diagnosis not present

## 2017-02-14 DIAGNOSIS — M1990 Unspecified osteoarthritis, unspecified site: Secondary | ICD-10-CM | POA: Insufficient documentation

## 2017-02-14 DIAGNOSIS — H2512 Age-related nuclear cataract, left eye: Secondary | ICD-10-CM | POA: Diagnosis not present

## 2017-02-14 DIAGNOSIS — K219 Gastro-esophageal reflux disease without esophagitis: Secondary | ICD-10-CM | POA: Diagnosis not present

## 2017-02-14 DIAGNOSIS — N400 Enlarged prostate without lower urinary tract symptoms: Secondary | ICD-10-CM | POA: Diagnosis not present

## 2017-02-14 DIAGNOSIS — I739 Peripheral vascular disease, unspecified: Secondary | ICD-10-CM | POA: Insufficient documentation

## 2017-02-14 HISTORY — DX: Bronchitis, not specified as acute or chronic: J40

## 2017-02-14 HISTORY — DX: Localized edema: R60.0

## 2017-02-14 HISTORY — DX: Gastric ulcer, unspecified as acute or chronic, without hemorrhage or perforation: K25.9

## 2017-02-14 HISTORY — DX: Hyperlipidemia, unspecified: E78.5

## 2017-02-14 HISTORY — DX: Personal history of other diseases of the digestive system: Z87.19

## 2017-02-14 HISTORY — DX: Transient cerebral ischemic attack, unspecified: G45.9

## 2017-02-14 HISTORY — PX: CATARACT EXTRACTION W/PHACO: SHX586

## 2017-02-14 LAB — GLUCOSE, CAPILLARY: GLUCOSE-CAPILLARY: 120 mg/dL — AB (ref 65–99)

## 2017-02-14 SURGERY — PHACOEMULSIFICATION, CATARACT, WITH IOL INSERTION
Anesthesia: Monitor Anesthesia Care | Site: Eye | Laterality: Left | Wound class: Clean

## 2017-02-14 MED ORDER — ARMC OPHTHALMIC DILATING DROPS
OPHTHALMIC | Status: AC
Start: 2017-02-14 — End: 2017-02-14
  Administered 2017-02-14: 1 via OPHTHALMIC
  Filled 2017-02-14: qty 0.4

## 2017-02-14 MED ORDER — LIDOCAINE HCL (PF) 4 % IJ SOLN
INTRAOCULAR | Status: DC | PRN
  Administered 2017-02-14: 2 mL via OPHTHALMIC

## 2017-02-14 MED ORDER — POVIDONE-IODINE 5 % OP SOLN
OPHTHALMIC | Status: DC | PRN
  Administered 2017-02-14: 1 via OPHTHALMIC

## 2017-02-14 MED ORDER — MOXIFLOXACIN HCL 0.5 % OP SOLN
OPHTHALMIC | Status: AC
  Filled 2017-02-14: qty 3

## 2017-02-14 MED ORDER — EPINEPHRINE PF 1 MG/ML IJ SOLN
INTRAMUSCULAR | Status: DC | PRN
  Administered 2017-02-14: 1 mL via OPHTHALMIC

## 2017-02-14 MED ORDER — CARBACHOL 0.01 % IO SOLN
INTRAOCULAR | Status: DC | PRN
  Administered 2017-02-14: .5 mL via INTRAOCULAR

## 2017-02-14 MED ORDER — MIDAZOLAM HCL 2 MG/2ML IJ SOLN
INTRAMUSCULAR | Status: AC
  Filled 2017-02-14: qty 2

## 2017-02-14 MED ORDER — NA CHONDROIT SULF-NA HYALURON 40-17 MG/ML IO SOLN
INTRAOCULAR | Status: DC | PRN
  Administered 2017-02-14: 1 mL via INTRAOCULAR

## 2017-02-14 MED ORDER — MOXIFLOXACIN HCL 0.5 % OP SOLN: 1.0000 [drp] | OPHTHALMIC | Status: DC | PRN

## 2017-02-14 MED ORDER — SODIUM CHLORIDE 0.9 % IV SOLN
INTRAVENOUS | Status: DC
  Administered 2017-02-14 (×2): via INTRAVENOUS

## 2017-02-14 MED ORDER — LIDOCAINE HCL (PF) 4 % IJ SOLN
INTRAMUSCULAR | Status: AC
  Filled 2017-02-14: qty 5

## 2017-02-14 MED ORDER — MOXIFLOXACIN HCL 0.5 % OP SOLN
OPHTHALMIC | Status: DC | PRN
  Administered 2017-02-14: .2 mL via OPHTHALMIC

## 2017-02-14 MED ORDER — POVIDONE-IODINE 5 % OP SOLN
OPHTHALMIC | Status: AC
  Filled 2017-02-14: qty 30

## 2017-02-14 MED ORDER — NA CHONDROIT SULF-NA HYALURON 40-17 MG/ML IO SOLN
INTRAOCULAR | Status: AC
  Filled 2017-02-14: qty 1

## 2017-02-14 MED ORDER — ARMC OPHTHALMIC DILATING DROPS
1.0000 "application " | OPHTHALMIC | Status: AC
  Administered 2017-02-14 (×3): 1 via OPHTHALMIC

## 2017-02-14 MED ORDER — EPINEPHRINE PF 1 MG/ML IJ SOLN
INTRAMUSCULAR | Status: AC
  Filled 2017-02-14: qty 2

## 2017-02-14 SURGICAL SUPPLY — 14 items
GLOVE BIO SURGEON STRL SZ8 (GLOVE) ×3 IMPLANT
GLOVE BIOGEL M 6.5 STRL (GLOVE) ×3 IMPLANT
GLOVE SURG LX 8.0 MICRO (GLOVE) ×2
GLOVE SURG LX STRL 8.0 MICRO (GLOVE) ×1 IMPLANT
GOWN STRL REUS W/ TWL LRG LVL3 (GOWN DISPOSABLE) ×2 IMPLANT
GOWN STRL REUS W/TWL LRG LVL3 (GOWN DISPOSABLE) ×4
LENS IOL TECNIS ITEC 17.5 (Intraocular Lens) ×3 IMPLANT
PACK CATARACT (MISCELLANEOUS) ×3 IMPLANT
PACK CATARACT BRASINGTON LX (MISCELLANEOUS) ×3 IMPLANT
SOL BSS BAG (MISCELLANEOUS) ×3
SOLUTION BSS BAG (MISCELLANEOUS) ×1 IMPLANT
SYR 5ML LL (SYRINGE) ×3 IMPLANT
WATER STERILE IRR 250ML POUR (IV SOLUTION) ×3 IMPLANT
WIPE NON LINTING 3.25X3.25 (MISCELLANEOUS) ×3 IMPLANT

## 2017-02-14 NOTE — Transfer of Care (Signed)
Immediate Anesthesia Transfer of Care Note  Patient: Ronald Duarte  Procedure(s) Performed: Procedure(s) with comments: CATARACT EXTRACTION PHACO AND INTRAOCULAR LENS PLACEMENT (IOC) (Left) - [place] 00:44 AP% 11.0  CDE 4.82 Fluid pack lot # [id number]  Patient Location: PACU  Anesthesia Type:General  Level of Consciousness: sedated  Airway & Oxygen Therapy: Patient Spontanous Breathing and Patient connected to nasal cannula oxygen  Post-op Assessment: Report given to RN and Post -op Vital signs reviewed and stable  Post vital signs: Reviewed and stable  Last Vitals:  Vitals:   02/06/17 1415 02/14/17 0725  BP: 117/78 (!) 137/93  Pulse: 78 70  Resp:  18  Temp:  36.3 C    Last Pain:  Vitals:   02/14/17 0725  TempSrc: Tympanic         Complications: No apparent anesthesia complications

## 2017-02-14 NOTE — Op Note (Signed)
PREOPERATIVE DIAGNOSIS:  Nuclear sclerotic cataract of the left eye.   POSTOPERATIVE DIAGNOSIS:  Nuclear sclerotic cataract of the left eye.   OPERATIVE PROCEDURE: Procedure(s): CATARACT EXTRACTION PHACO AND INTRAOCULAR LENS PLACEMENT (IOC)   SURGEON:  Birder Robson, MD.   ANESTHESIA:  Anesthesiologist: Alvin Critchley, MD CRNA: Justus Memory, CRNA  1.      Managed anesthesia care. 2.     0.45ml of Shugarcaine was instilled following the paracentesis   COMPLICATIONS:  None.   TECHNIQUE:   Stop and chop   DESCRIPTION OF PROCEDURE:  The patient was examined and consented in the preoperative holding area where the aforementioned topical anesthesia was applied to the left eye and then brought back to the Operating Room where the left eye was prepped and draped in the usual sterile ophthalmic fashion and a lid speculum was placed. A paracentesis was created with the side port blade and the anterior chamber was filled with viscoelastic. A near clear corneal incision was performed with the steel keratome. A continuous curvilinear capsulorrhexis was performed with a cystotome followed by the capsulorrhexis forceps. Hydrodissection and hydrodelineation were carried out with BSS on a blunt cannula. The lens was removed in a stop and chop  technique and the remaining cortical material was removed with the irrigation-aspiration handpiece. The capsular bag was inflated with viscoelastic and the Technis [id number] lens was placed in the capsular bag without complication. The remaining viscoelastic was removed from the eye with the irrigation-aspiration handpiece. The wounds were hydrated. The anterior chamber was flushed with Miostat and the eye was inflated to physiologic pressure. 0.28ml Vigamox was placed in the anterior chamber. The wounds were found to be water tight. The eye was dressed with Vigamox. The patient was given protective glasses to wear throughout the day and a shield with which to sleep  tonight. The patient was also given drops with which to begin a drop regimen today and will follow-up with me in one day.  Implant Name Type Inv. Item Serial No. Manufacturer Lot No. LRB No. Used  LENS IOL DIOP 17.5 - [id number] Intraocular Lens LENS IOL DIOP 17.5 [id number]   Left 1    Procedure(s) with comments: CATARACT EXTRACTION PHACO AND INTRAOCULAR LENS PLACEMENT (IOC) (Left) - [place] 00:44 AP% 11.0  CDE 4.82 Fluid pack lot # [id number]  Electronically signed: [place] 02/14/2017 9:00 AM

## 2017-02-14 NOTE — Discharge Instructions (Signed)
Eye Surgery Discharge Instructions  Expect mild scratchy sensation or mild soreness. DO NOT RUB YOUR EYE!  The day of surgery:  Minimal physical activity, but bed rest is not required  No reading, computer work, or close hand work  No bending, lifting, or straining.  May watch TV  For 24 hours:  No driving, legal decisions, or alcoholic beverages  Safety precautions  Eat anything you prefer: It is better to start with liquids, then soup then solid foods.  _____ Eye patch should be worn until postoperative exam tomorrow.  ____ Solar shield eyeglasses should be worn for comfort in the sunlight/patch while sleeping  Resume all regular medications including aspirin or Coumadin if these were discontinued prior to surgery. You may shower, bathe, shave, or wash your hair. Tylenol may be taken for mild discomfort.  Call your doctor if you experience significant pain, nausea, or vomiting, fever > 101 or other signs of infection. [phone] or [phone] Specific instructions:  Follow-up Information    Birder Robson, MD Follow up.   Specialty:  Ophthalmology Why:  June 20 at 8:40am Contact information: [address] [place] [address] [phone]

## 2017-02-14 NOTE — Anesthesia Postprocedure Evaluation (Signed)
Anesthesia Post Note  Patient: Ronald Duarte  Procedure(s) Performed: Procedure(s) (LRB): CATARACT EXTRACTION PHACO AND INTRAOCULAR LENS PLACEMENT (IOC) (Left)  Patient location during evaluation: PACU Anesthesia Type: MAC Level of consciousness: awake and alert and oriented Pain management: pain level controlled Vital Signs Assessment: post-procedure vital signs reviewed and stable Respiratory status: spontaneous breathing Cardiovascular status: blood pressure returned to baseline Anesthetic complications: no     Last Vitals:  Vitals:   02/14/17 0857 02/14/17 0908  BP: 133/84 125/83  Pulse: 65 68  Resp: 16   Temp: 36.2 C     Last Pain:  Vitals:   02/14/17 0725  TempSrc: Tympanic                 Safwan Tomei

## 2017-02-14 NOTE — H&P (Signed)
All labs reviewed. Abnormal studies sent to patients PCP when indicated.  Previous H&P reviewed, patient examined, there are NO CHANGES.  Audrena Talaga LOUIS6/19/20188:29 AM

## 2017-02-14 NOTE — Anesthesia Post-op Follow-up Note (Cosign Needed)
Anesthesia QCDR form completed.        

## 2017-02-14 NOTE — Anesthesia Preprocedure Evaluation (Signed)
Anesthesia Evaluation  Patient identified by MRN, date of birth, ID band Patient awake    Reviewed: Allergy & Precautions, H&P , NPO status , Patient's Chart, lab work & pertinent test results, reviewed documented beta blocker date and time   Airway Mallampati: II  TM Distance: >3 FB Neck ROM: full    Dental no notable dental hx. (+) Teeth Intact   Pulmonary neg pulmonary ROS, COPD,  COPD inhaler,    Pulmonary exam normal breath sounds clear to auscultation       Cardiovascular Exercise Tolerance: Good hypertension, + CAD and + Peripheral Vascular Disease  negative cardio ROS   Rhythm:regular Rate:Normal     Neuro/Psych TIACVA, No Residual Symptoms negative neurological ROS  negative psych ROS   GI/Hepatic negative GI ROS, Neg liver ROS, hiatal hernia, PUD, GERD  Medicated,  Endo/Other  negative endocrine ROSdiabetes  Renal/GU      Musculoskeletal   Abdominal   Peds  Hematology negative hematology ROS (+)   Anesthesia Other Findings   Reproductive/Obstetrics negative OB ROS                             Anesthesia Physical  Anesthesia Plan  ASA: III  Anesthesia Plan: MAC   Post-op Pain Management:    Induction: Intravenous  PONV Risk Score and Plan:   Airway Management Planned: Nasal Cannula  Additional Equipment:   Intra-op Plan:   Post-operative Plan:   Informed Consent: I have reviewed the patients History and Physical, chart, labs and discussed the procedure including the risks, benefits and alternatives for the proposed anesthesia with the patient or authorized representative who has indicated his/her understanding and acceptance.     Plan Discussed with: CRNA and Surgeon  Anesthesia Plan Comments:         Anesthesia Quick Evaluation

## 2017-05-30 DIAGNOSIS — M5416 Radiculopathy, lumbar region: Secondary | ICD-10-CM | POA: Insufficient documentation

## 2017-09-10 IMAGING — CR DG CHEST 1V
1 series · 1 of 1 positions shown · non-contrast
Comparison: 09/23/2016

CLINICAL DATA: 70-year-old male with a history of metallic shrapnel

EXAM:
CHEST 1 VIEW

[chest pa]
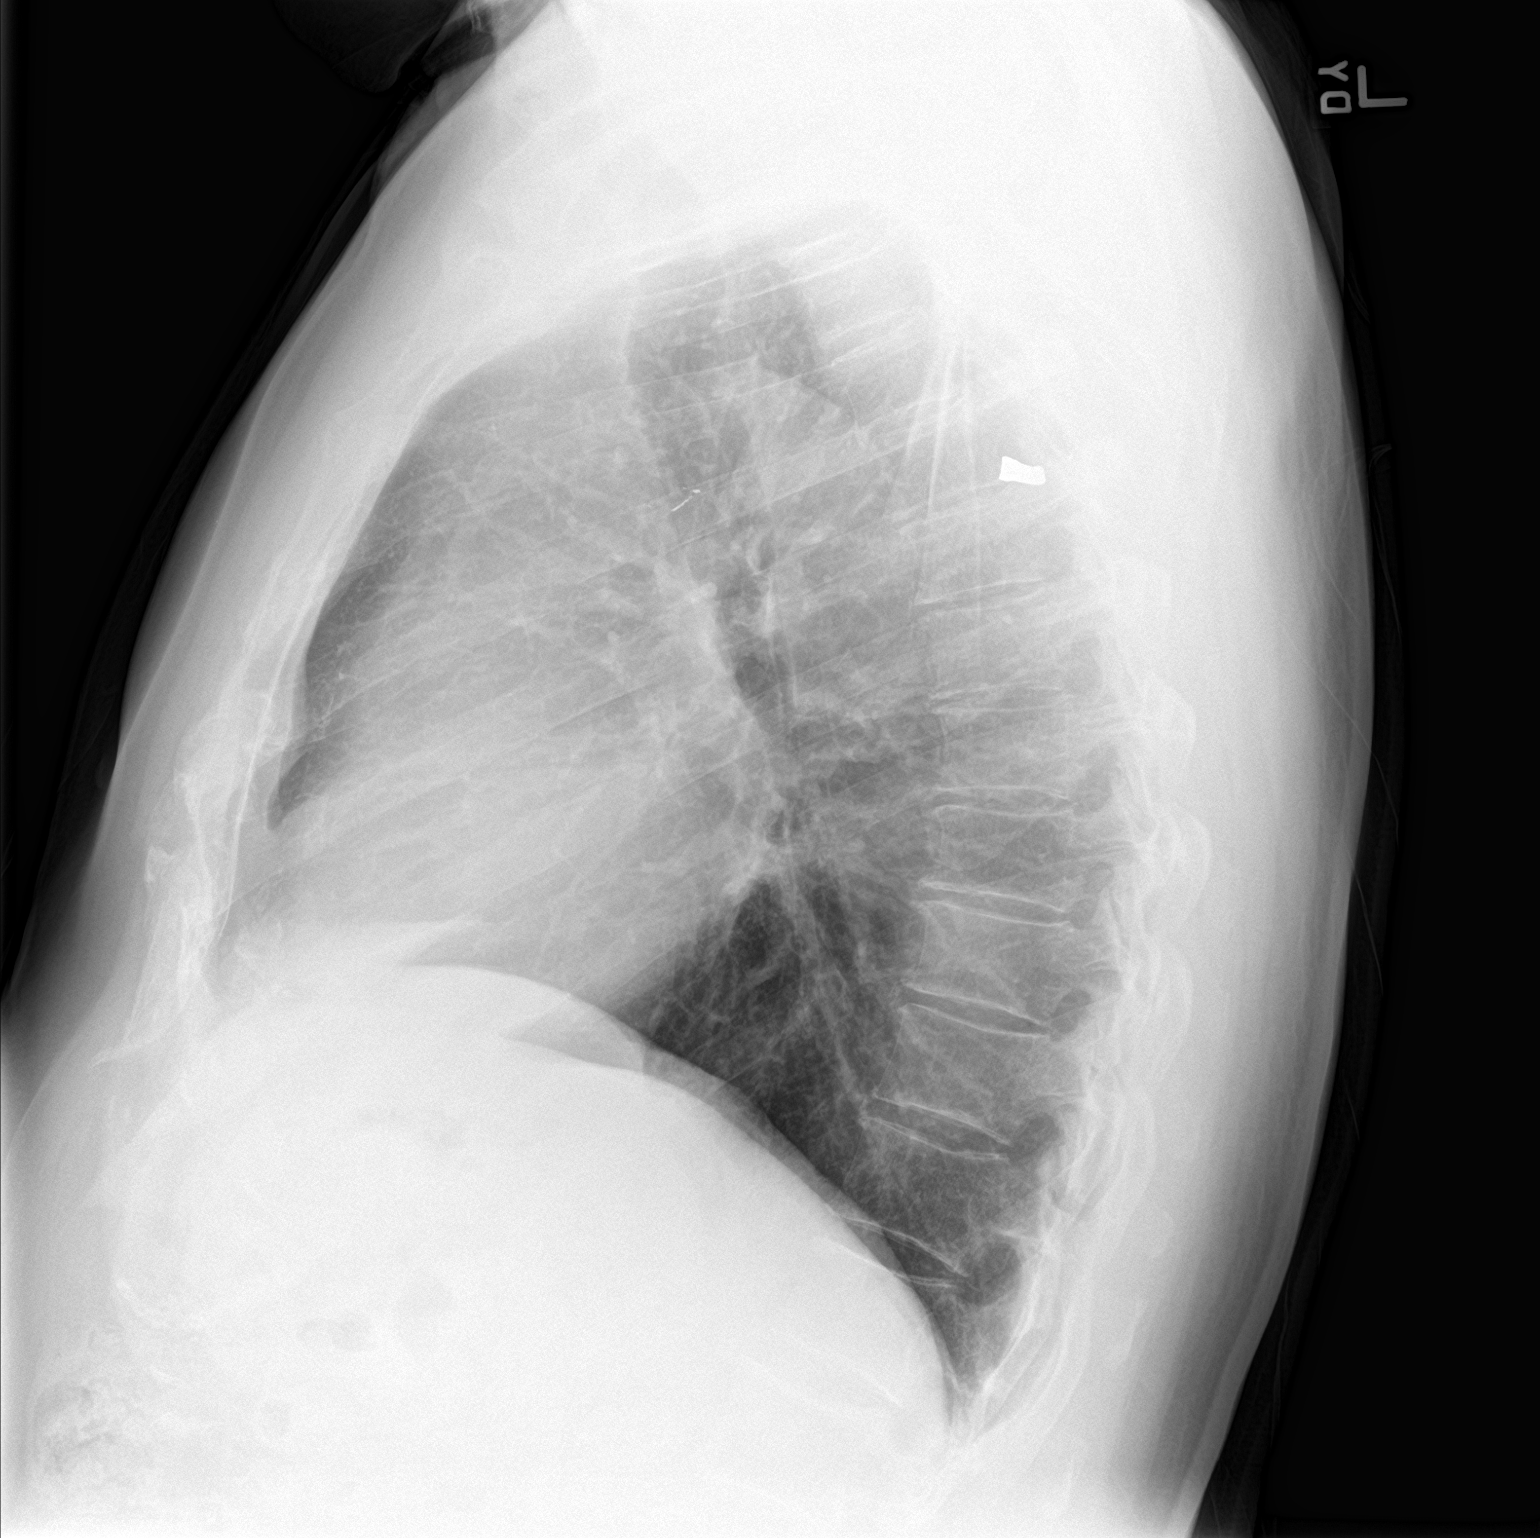

[1 of 1 positions shown; findings below may reference images not displayed]

FINDINGS: Lateral view as a supplement to the anterior view performed on
today's date.

Radiopaque foreign body projects at the posterior aspect of the
chest, overlying posterior elements and is most likely associated
with the posterior right fourth rib given the appearance on the
anterior view of today's date.

No pleural effusion or confluent airspace disease.
IMPRESSION: Metallic shrapnel most likely associated with the posterior right
fourth rib/ chest wall and not in the lung.

## 2017-11-23 ENCOUNTER — Ambulatory Visit (INDEPENDENT_AMBULATORY_CARE_PROVIDER_SITE_OTHER): Payer: Medicare Other | Admitting: Urology

## 2017-11-23 ENCOUNTER — Encounter: Payer: Self-pay | Admitting: Urology

## 2017-11-23 VITALS — BP 144/76 | HR 81 | Ht 70.0 in | Wt 247.7 lb

## 2017-11-23 DIAGNOSIS — N3281 Overactive bladder: Secondary | ICD-10-CM | POA: Diagnosis not present

## 2017-11-23 NOTE — Progress Notes (Signed)
11/23/2017 8:48 AM   Ronald Duarte [birth year] [id number]  Referring provider: Adin Hector, MD [facility] [place], [place] [address]  Chief Complaint  Patient presents with  . Urinary Frequency    HPI: The patient is a 72 year old male who presents for annual follow-up of his urinary frequency.  He was tried both on Flomax and Myrbetriq for this last year.  Since that time, he has stopped both of these medications.  Currently his symptoms have significantly improved.  He has nocturia x2 on most nights however it can be as frequent as 4 times.  He has mild urinary urgency.  Overall, his symptoms have dramatically improved over the last 6 months.  He does not find the symptoms bothersome enough to want to resume any sort of medication.  He feels that he empties his bladder with a good stream.  He denies hesitancy or intermittency.   PMH: Past Medical History:  Diagnosis Date  . BPH (benign prostatic hyperplasia)   . Bronchitis   . Diabetes mellitus without complication ([place])   . DVT (deep venous thrombosis) ([place])    H/O LEFT LEG  . Elevated lipids   . GERD (gastroesophageal reflux disease)   . History of hiatal hernia   . HOH (hard of hearing)   . Hypertension   . Lower extremity edema   . Multiple gastric ulcers   . Neuropathy    MILD IN RIGHT LEG  . Pericarditis 1989  . Stroke [facility])    TIA 1987  . TIA (transient ischemic attack)     Surgical History: Past Surgical History:  Procedure Laterality Date  . CARDIAC CATHETERIZATION     X 2  . CATARACT EXTRACTION W/PHACO Right 06/28/2016   Procedure: CATARACT EXTRACTION PHACO AND INTRAOCULAR LENS PLACEMENT (IOC);  Surgeon: Birder Robson, MD;  Location: [facility] ORS;  Service: Ophthalmology;  Laterality: Right;  [place] 1.02 AP% 21.7 CDE 13.51 Fluid pack lot # [id number]  . CATARACT EXTRACTION W/PHACO Left 02/14/2017   Procedure: CATARACT EXTRACTION PHACO AND INTRAOCULAR LENS PLACEMENT  ([place]);  Surgeon: Birder Robson, MD;  Location: [facility] ORS;  Service: Ophthalmology;  Laterality: Left;  [place] 00:44 AP% 11.0  CDE 4.82 Fluid pack lot # [id number]  . KNEE ARTHROSCOPY  2014/2016  . toenail removed      Home Medications:  Allergies as of 11/23/2017   No Known Allergies     Medication List        Accurate as of 11/23/17  8:48 AM. Always use your most recent med list.          albuterol 108 (90 Base) MCG/ACT inhaler Commonly known as:  PROVENTIL HFA;VENTOLIN HFA Inhale 2 puffs into the lungs every 6 (six) hours as needed for wheezing or shortness of breath.   Biotin 10000 MCG Tabs Take 10,000 mcg by mouth daily with supper.   cetirizine 10 MG tablet Commonly known as:  ZYRTEC Take 10 mg by mouth daily.   CoQ10 200 MG Caps Take 200 mg by mouth daily with supper.   dutasteride 0.5 MG capsule Commonly known as:  AVODART Take 0.5 mg by mouth at bedtime.   ezetimibe 10 MG tablet Commonly known as:  ZETIA Take 10 mg by mouth at bedtime.   folic acid 299 MCG tablet Commonly known as:  FOLVITE Take 400 mcg by mouth daily with supper.   ibuprofen 200 MG tablet Commonly known as:  ADVIL,MOTRIN Take 200 mg by mouth every 8 (  eight) hours as needed (for pain (seldom)).   ILEVRO 0.3 % ophthalmic suspension Generic drug:  nepafenac Place 1 drop into the right eye daily.   metFORMIN 500 MG tablet Commonly known as:  GLUCOPHAGE Take 500 mg by mouth 3 (three) times daily with meals.   nebivolol 2.5 MG tablet Commonly known as:  BYSTOLIC Take 2.5 mg by mouth every evening.   ranitidine 150 MG tablet Commonly known as:  ZANTAC Take 150 mg by mouth 2 (two) times daily as needed for heartburn.   rivaroxaban 20 MG Tabs tablet Commonly known as:  XARELTO Take 20 mg by mouth daily after supper.   rosuvastatin 20 MG tablet Commonly known as:  CRESTOR Take 20 mg by mouth at bedtime.       Allergies: No Known Allergies  Family History: Family History    Problem Relation Age of Onset  . Prostate cancer Neg Hx   . Hematuria Neg Hx   . Sickle cell anemia Neg Hx   . Tuberculosis Neg Hx   . Bladder Cancer Neg Hx   . Kidney cancer Neg Hx     Social History:  reports that he has never smoked. He has never used smokeless tobacco. He reports that he drinks alcohol. He reports that he does not use drugs.  ROS: UROLOGY Frequent Urination?: Yes Hard to postpone urination?: No Burning/pain with urination?: No Get up at night to urinate?: Yes Leakage of urine?: No Urine stream starts and stops?: No Trouble starting stream?: No Do you have to strain to urinate?: No Blood in urine?: No Urinary tract infection?: No Sexually transmitted disease?: No Injury to kidneys or bladder?: No Painful intercourse?: No Weak stream?: No Erection problems?: No Penile pain?: No  Gastrointestinal Nausea?: No Vomiting?: Yes Indigestion/heartburn?: Yes Diarrhea?: No Constipation?: No  Constitutional Fever: No Night sweats?: No Weight loss?: No Fatigue?: No  Skin Skin rash/lesions?: No Itching?: No  Eyes Blurred vision?: No Double vision?: No  Ears/Nose/Throat Sore throat?: No Sinus problems?: No  Hematologic/Lymphatic Swollen glands?: No Easy bruising?: No  Cardiovascular Leg swelling?: No Chest pain?: No  Respiratory Cough?: No Shortness of breath?: No  Endocrine Excessive thirst?: No  Musculoskeletal Back pain?: No Joint pain?: No  Neurological Headaches?: No Dizziness?: No  Psychologic Depression?: No Anxiety?: No  Physical Exam: BP (!) 144/76 (BP Location: Right Arm, Patient Position: Sitting, Cuff Size: Normal)   Pulse 81   Ht 5\' 10"  (1.778 m)   Wt 247 lb 11.2 oz (112.4 kg)   BMI 35.54 kg/m   Constitutional:  Alert and oriented, No acute distress. HEENT: [place] AT, moist mucus membranes.  Trachea midline, no masses. Cardiovascular: No clubbing, cyanosis, or edema. Respiratory: Normal respiratory effort, no  increased work of breathing. GI: Abdomen is soft, nontender, nondistended, no abdominal masses GU: No CVA tenderness.  Skin: No rashes, bruises or suspicious lesions. Lymph: No cervical or inguinal adenopathy. Neurologic: Grossly intact, no focal deficits, moving all 4 extremities. Psychiatric: Normal mood and affect.  Laboratory Data: No results found for: WBC, HGB, HCT, MCV, PLT  No results found for: CREATININE  No results found for: PSA  No results found for: TESTOSTERONE  No results found for: HGBA1C  Urinalysis    Component Value Date/Time   APPEARANCEUR Clear 10/25/2016 1437   GLUCOSEU 1+ (A) 10/25/2016 1437   BILIRUBINUR Negative 10/25/2016 1437   PROTEINUR Negative 10/25/2016 1437   NITRITE Negative 10/25/2016 1437   LEUKOCYTESUR Negative 10/25/2016 1437    Assessment &  Plan:    1.  Overactive bladder This has improved her last 6 months without medications.  The patient is not interested in trying any further medications at this time for this issue.  He can follow-up with [place] as needed if his symptoms worsen.  Return if symptoms worsen or fail to improve.  Nickie Retort, MD  [facility] [address], [place],  [address] [phone]

## 2018-03-07 ENCOUNTER — Other Ambulatory Visit: Payer: Self-pay | Admitting: Internal Medicine

## 2018-03-07 DIAGNOSIS — M7989 Other specified soft tissue disorders: Secondary | ICD-10-CM

## 2018-03-09 ENCOUNTER — Ambulatory Visit
Admission: RE | Admit: 2018-03-09 | Discharge: 2018-03-09 | Disposition: A | Discharge status: Home / Self Care | Payer: Medicare Other | Source: Ambulatory Visit | Attending: Internal Medicine | Admitting: Internal Medicine

## 2018-03-09 DIAGNOSIS — M7989 Other specified soft tissue disorders: Secondary | ICD-10-CM | POA: Diagnosis present

## 2018-09-10 ENCOUNTER — Other Ambulatory Visit: Payer: Self-pay

## 2018-09-10 DIAGNOSIS — K219 Gastro-esophageal reflux disease without esophagitis: Secondary | ICD-10-CM

## 2018-09-10 DIAGNOSIS — Z1211 Encounter for screening for malignant neoplasm of colon: Secondary | ICD-10-CM

## 2018-09-17 ENCOUNTER — Other Ambulatory Visit: Payer: Self-pay

## 2018-09-18 ENCOUNTER — Telehealth: Payer: Self-pay

## 2018-09-18 NOTE — Telephone Encounter (Signed)
Patient returned call.  He has been advised per Dr. Aquilla Hacker fax to stop Xarelto 5 days prior to procedure.  Restart 1 day after procedure.  Thanks [employer]

## 2018-09-18 NOTE — Telephone Encounter (Signed)
Left message with patients wife to ask her husband to call office back regarding blood thinner advice.  Dr. Caryl Comes has advised by fax for pt to stop Xarelto 5 days prior to procedure.  Restart 1 day after procedure.  Stop date would be 09/20/18 and restart date would be 01/29.  Procedure date is 09/25/18.  Thanks [employer]

## 2018-09-25 ENCOUNTER — Encounter: Payer: Self-pay | Admitting: *Deleted

## 2018-09-25 ENCOUNTER — Ambulatory Visit: Payer: Medicare Other | Admitting: Certified Registered"

## 2018-09-25 ENCOUNTER — Ambulatory Visit
Admission: RE | Admit: 2018-09-25 | Discharge: 2018-09-25 | Disposition: A | Discharge status: Home / Self Care | Payer: Medicare Other | Source: Ambulatory Visit | Attending: Gastroenterology | Admitting: Gastroenterology

## 2018-09-25 ENCOUNTER — Encounter
Admission: RE | Disposition: A | Discharge status: Home / Self Care | Payer: Self-pay | Source: Ambulatory Visit | Attending: Gastroenterology

## 2018-09-25 DIAGNOSIS — K219 Gastro-esophageal reflux disease without esophagitis: Secondary | ICD-10-CM | POA: Diagnosis not present

## 2018-09-25 DIAGNOSIS — Z86010 Personal history of colonic polyps: Secondary | ICD-10-CM | POA: Diagnosis not present

## 2018-09-25 DIAGNOSIS — Z1211 Encounter for screening for malignant neoplasm of colon: Secondary | ICD-10-CM

## 2018-09-25 DIAGNOSIS — K449 Diaphragmatic hernia without obstruction or gangrene: Secondary | ICD-10-CM | POA: Diagnosis not present

## 2018-09-25 DIAGNOSIS — R12 Heartburn: Secondary | ICD-10-CM | POA: Diagnosis present

## 2018-09-25 DIAGNOSIS — Z8673 Personal history of transient ischemic attack (TIA), and cerebral infarction without residual deficits: Secondary | ICD-10-CM | POA: Diagnosis not present

## 2018-09-25 DIAGNOSIS — K641 Second degree hemorrhoids: Secondary | ICD-10-CM | POA: Insufficient documentation

## 2018-09-25 DIAGNOSIS — K2100 Gastro-esophageal reflux disease with esophagitis, without bleeding: Secondary | ICD-10-CM

## 2018-09-25 DIAGNOSIS — D124 Benign neoplasm of descending colon: Secondary | ICD-10-CM | POA: Diagnosis not present

## 2018-09-25 DIAGNOSIS — Z7901 Long term (current) use of anticoagulants: Secondary | ICD-10-CM | POA: Insufficient documentation

## 2018-09-25 DIAGNOSIS — Z7984 Long term (current) use of oral hypoglycemic drugs: Secondary | ICD-10-CM | POA: Insufficient documentation

## 2018-09-25 DIAGNOSIS — Z79899 Other long term (current) drug therapy: Secondary | ICD-10-CM | POA: Diagnosis not present

## 2018-09-25 DIAGNOSIS — Z8711 Personal history of peptic ulcer disease: Secondary | ICD-10-CM | POA: Diagnosis not present

## 2018-09-25 DIAGNOSIS — K621 Rectal polyp: Secondary | ICD-10-CM | POA: Insufficient documentation

## 2018-09-25 DIAGNOSIS — K5730 Diverticulosis of large intestine without perforation or abscess without bleeding: Secondary | ICD-10-CM | POA: Diagnosis not present

## 2018-09-25 DIAGNOSIS — N400 Enlarged prostate without lower urinary tract symptoms: Secondary | ICD-10-CM | POA: Insufficient documentation

## 2018-09-25 DIAGNOSIS — E1140 Type 2 diabetes mellitus with diabetic neuropathy, unspecified: Secondary | ICD-10-CM | POA: Insufficient documentation

## 2018-09-25 DIAGNOSIS — Z86718 Personal history of other venous thrombosis and embolism: Secondary | ICD-10-CM | POA: Diagnosis not present

## 2018-09-25 DIAGNOSIS — I10 Essential (primary) hypertension: Secondary | ICD-10-CM | POA: Insufficient documentation

## 2018-09-25 HISTORY — PX: ESOPHAGOGASTRODUODENOSCOPY (EGD) WITH PROPOFOL: SHX5813

## 2018-09-25 HISTORY — PX: COLONOSCOPY WITH PROPOFOL: SHX5780

## 2018-09-25 LAB — GLUCOSE, CAPILLARY: Glucose-Capillary: 117 mg/dL — ABNORMAL HIGH (ref 70–99)

## 2018-09-25 SURGERY — COLONOSCOPY WITH PROPOFOL
Anesthesia: General

## 2018-09-25 MED ORDER — SODIUM CHLORIDE 0.9 % IV SOLN
INTRAVENOUS | Status: DC
  Administered 2018-09-25: 1000 mL via INTRAVENOUS

## 2018-09-25 MED ORDER — PROPOFOL 500 MG/50ML IV EMUL
INTRAVENOUS | Status: DC | PRN
  Administered 2018-09-25: 185 ug/kg/min via INTRAVENOUS

## 2018-09-25 MED ORDER — PANTOPRAZOLE SODIUM 40 MG PO TBEC: 40.0000 mg | DELAYED_RELEASE_TABLET | Freq: Every day | ORAL | 11 refills | Status: AC

## 2018-09-25 MED ORDER — PROPOFOL 10 MG/ML IV BOLUS
INTRAVENOUS | Status: AC
  Filled 2018-09-25: qty 20

## 2018-09-25 MED ORDER — PROPOFOL 500 MG/50ML IV EMUL
INTRAVENOUS | Status: AC
Start: 2018-09-25 — End: ?
  Filled 2018-09-25: qty 50

## 2018-09-25 MED ORDER — PROPOFOL 10 MG/ML IV BOLUS
INTRAVENOUS | Status: DC | PRN
  Administered 2018-09-25: 60 mg via INTRAVENOUS

## 2018-09-25 MED ORDER — LIDOCAINE HCL (CARDIAC) PF 100 MG/5ML IV SOSY
PREFILLED_SYRINGE | INTRAVENOUS | Status: DC | PRN
  Administered 2018-09-25: 50 mg via INTRAVENOUS

## 2018-09-25 NOTE — Anesthesia Postprocedure Evaluation (Signed)
Anesthesia Post Note  Patient: Ronald Duarte  Procedure(s) Performed: COLONOSCOPY WITH PROPOFOL (N/A ) ESOPHAGOGASTRODUODENOSCOPY (EGD) WITH PROPOFOL (N/A )  Patient location during evaluation: Endoscopy Anesthesia Type: General Level of consciousness: awake and alert and oriented Pain management: pain level controlled Vital Signs Assessment: post-procedure vital signs reviewed and stable Respiratory status: spontaneous breathing, nonlabored ventilation and respiratory function stable Cardiovascular status: blood pressure returned to baseline and stable Postop Assessment: no signs of nausea or vomiting Anesthetic complications: no     Last Vitals:  Vitals:   09/25/18 0950 09/25/18 1000  BP: (!) 123/94 138/80  Pulse: 78 67  Resp: 14 (!) 21  Temp:    SpO2: 97% 97%    Last Pain:  Vitals:   09/25/18 0935  TempSrc: Tympanic  PainSc:                  Daniella Dewberry

## 2018-09-25 NOTE — Anesthesia Preprocedure Evaluation (Signed)
Anesthesia Evaluation  Patient identified by MRN, date of birth, ID band Patient awake    Reviewed: Allergy & Precautions, NPO status , Patient's Chart, lab work & pertinent test results  History of Anesthesia Complications Negative for: history of anesthetic complications  Airway Mallampati: III  TM Distance: >3 FB Neck ROM: Full    Dental no notable dental hx.    Pulmonary neg pulmonary ROS, neg sleep apnea, neg COPD,    breath sounds clear to auscultation- rhonchi (-) wheezing      Cardiovascular Exercise Tolerance: Good hypertension, Pt. on medications (-) CAD, (-) Past MI, (-) Cardiac Stents and (-) CABG  Rhythm:Regular Rate:Normal - Systolic murmurs and - Diastolic murmurs    Neuro/Psych TIACVA, No Residual Symptoms negative psych ROS   GI/Hepatic Neg liver ROS, hiatal hernia, PUD, GERD  Medicated,  Endo/Other  diabetes, Oral Hypoglycemic Agents  Renal/GU negative Renal ROS     Musculoskeletal negative musculoskeletal ROS (+)   Abdominal (+) + obese,   Peds  Hematology negative hematology ROS (+)   Anesthesia Other Findings Past Medical History: No date: BPH (benign prostatic hyperplasia) No date: Bronchitis No date: Diabetes mellitus without complication ([facility]) No date: DVT (deep venous thrombosis) ([facility])     Comment:  H/O LEFT LEG No date: Elevated lipids No date: GERD (gastroesophageal reflux disease) No date: History of hiatal hernia No date: HOH (hard of hearing) No date: Hypertension No date: Lower extremity edema No date: Multiple gastric ulcers No date: Neuropathy     Comment:  MILD IN RIGHT LEG 1989: Pericarditis No date: Stroke [facility])     Comment:  TIA 1987 No date: TIA (transient ischemic attack)   Reproductive/Obstetrics                             Anesthesia Physical Anesthesia Plan  ASA: III  Anesthesia Plan: General   Post-op Pain Management:     Induction: Intravenous  PONV Risk Score and Plan: 1 and Propofol infusion  Airway Management Planned: Natural Airway  Additional Equipment:   Intra-op Plan:   Post-operative Plan:   Informed Consent: I have reviewed the patients History and Physical, chart, labs and discussed the procedure including the risks, benefits and alternatives for the proposed anesthesia with the patient or authorized representative who has indicated his/her understanding and acceptance.     Dental advisory given  Plan Discussed with: CRNA and Anesthesiologist  Anesthesia Plan Comments:         Anesthesia Quick Evaluation

## 2018-09-25 NOTE — Transfer of Care (Signed)
Immediate Anesthesia Transfer of Care Note  Patient: Jibreel Fedewa  Procedure(s) Performed: COLONOSCOPY WITH PROPOFOL (N/A ) ESOPHAGOGASTRODUODENOSCOPY (EGD) WITH PROPOFOL (N/A )  Patient Location: Endoscopy Unit  Anesthesia Type:General  Level of Consciousness: awake, alert  and oriented  Airway & Oxygen Therapy: Patient Spontanous Breathing and Patient connected to nasal cannula oxygen  Post-op Assessment: Report given to RN and Post -op Vital signs reviewed and stable  Post vital signs: Reviewed and stable  Last Vitals:  Vitals Value Taken Time  BP 119/72 09/25/2018  9:34 AM  Temp    Pulse 77 09/25/2018  9:34 AM  Resp 13 09/25/2018  9:34 AM  SpO2 96 % 09/25/2018  9:34 AM  Vitals shown include unvalidated device data.  Last Pain:  Vitals:   09/25/18 0831  TempSrc: Tympanic  PainSc: 0-No pain         Complications: No apparent anesthesia complications

## 2018-09-25 NOTE — Anesthesia Post-op Follow-up Note (Signed)
Anesthesia QCDR form completed.        

## 2018-09-25 NOTE — H&P (Signed)
Lucilla Lame, MD [address] [place], [place] [address] Phone:[phone] Fax : [phone]  Primary Care Physician:  Adin Hector, MD Primary Gastroenterologist:  Dr. Allen Norris  Pre-Procedure History & Physical: HPI:  Ronald Duarte is a 73 y.o. male is here for an endoscopy and colonoscopy.   Past Medical History:  Diagnosis Date  . BPH (benign prostatic hyperplasia)   . Bronchitis   . Diabetes mellitus without complication ([place])   . DVT (deep venous thrombosis) ([place])    H/O LEFT LEG  . Elevated lipids   . GERD (gastroesophageal reflux disease)   . History of hiatal hernia   . HOH (hard of hearing)   . Hypertension   . Lower extremity edema   . Multiple gastric ulcers   . Neuropathy    MILD IN RIGHT LEG  . Pericarditis 1989  . Stroke [facility])    TIA 1987  . TIA (transient ischemic attack)     Past Surgical History:  Procedure Laterality Date  . CARDIAC CATHETERIZATION     X 2  . CATARACT EXTRACTION W/PHACO Right 06/28/2016   Procedure: CATARACT EXTRACTION PHACO AND INTRAOCULAR LENS PLACEMENT (IOC);  Surgeon: Birder Robson, MD;  Location: [facility] ORS;  Service: Ophthalmology;  Laterality: Right;  [place] 1.02 AP% 21.7 CDE 13.51 Fluid pack lot # [id number]  . CATARACT EXTRACTION W/PHACO Left 02/14/2017   Procedure: CATARACT EXTRACTION PHACO AND INTRAOCULAR LENS PLACEMENT ([place]);  Surgeon: Birder Robson, MD;  Location: [facility] ORS;  Service: Ophthalmology;  Laterality: Left;  [place] 00:44 AP% 11.0  CDE 4.82 Fluid pack lot # [id number]  . KNEE ARTHROSCOPY  2014/2016  . toenail removed      Prior to Admission medications   Medication Sig Start Date End Date Taking? Authorizing Provider  famotidine (PEPCID) 20 MG tablet Take 20 mg by mouth 2 (two) times daily.   Yes [provider]  nebivolol (BYSTOLIC) 2.5 MG tablet Take 2.5 mg by mouth every evening.    Yes [provider]  albuterol (PROVENTIL HFA;VENTOLIN HFA) 108 (90 Base) MCG/ACT  inhaler Inhale 2 puffs into the lungs every 6 (six) hours as needed for wheezing or shortness of breath.    [provider]  Biotin 10000 MCG TABS Take 10,000 mcg by mouth daily with supper.    [provider]  cetirizine (ZYRTEC) 10 MG tablet Take 10 mg by mouth daily.     [provider]  Coenzyme Q10 (COQ10) 200 MG CAPS Take 200 mg by mouth daily with supper.    [provider]  dutasteride (AVODART) 0.5 MG capsule Take 0.5 mg by mouth at bedtime.    [provider]  ezetimibe (ZETIA) 10 MG tablet Take 10 mg by mouth at bedtime.    [provider]  folic acid (FOLVITE) 938 MCG tablet Take 400 mcg by mouth daily with supper.    [provider]  ibuprofen (ADVIL,MOTRIN) 200 MG tablet Take 200 mg by mouth every 8 (eight) hours as needed (for pain (seldom)).    [provider]  metFORMIN (GLUCOPHAGE) 500 MG tablet Take 500 mg by mouth 3 (three) times daily with meals.    [provider]  rivaroxaban (XARELTO) 20 MG TABS tablet Take 20 mg by mouth daily after supper.    [provider]  rosuvastatin (CRESTOR) 20 MG tablet Take 20 mg by mouth at bedtime.    [provider]    Allergies as of 09/10/2018  . (No  Known Allergies)    Family History  Problem Relation Age of Onset  . Prostate cancer Neg Hx   . Hematuria Neg Hx   . Sickle cell anemia Neg Hx   . Tuberculosis Neg Hx   . Bladder Cancer Neg Hx   . Kidney cancer Neg Hx     Social History   Socioeconomic History  . Marital status: Married    Spouse name: Not on file  . Number of children: Not on file  . Years of education: Not on file  . Highest education level: Not on file  Occupational History  . Not on file  Social Needs  . Financial resource strain: Not on file  . Food insecurity:    Worry: Not on file    Inability: Not on file  . Transportation needs:    Medical: Not on file    Non-medical: Not on file  Tobacco Use  .  Smoking status: Never Smoker  . Smokeless tobacco: Never Used  Substance and Sexual Activity  . Alcohol use: Yes    Comment: OCCAS  . Drug use: No  . Sexual activity: Not on file  Lifestyle  . Physical activity:    Days per week: Not on file    Minutes per session: Not on file  . Stress: Not on file  Relationships  . Social connections:    Talks on phone: Not on file    Gets together: Not on file    Attends religious service: Not on file    Active member of club or organization: Not on file    Attends meetings of clubs or organizations: Not on file    Relationship status: Not on file  . Intimate partner violence:    Fear of current or ex partner: Not on file    Emotionally abused: Not on file    Physically abused: Not on file    Forced sexual activity: Not on file  Other Topics Concern  . Not on file  Social History Narrative  . Not on file    Review of Systems: See HPI, otherwise negative ROS  Physical Exam: BP (!) 156/87   Pulse 72   Temp (!) 97.1 F (36.2 C) (Tympanic)   Resp 16   Ht 5\' 10"  (1.778 m)   Wt 106.6 kg   SpO2 97%   BMI 33.72 kg/m  General:   Alert,  pleasant and cooperative in NAD Head:  Normocephalic and atraumatic. Neck:  Supple; no masses or thyromegaly. Lungs:  Clear throughout to auscultation.    Heart:  Regular rate and rhythm. Abdomen:  Soft, nontender and nondistended. Normal bowel sounds, without guarding, and without rebound.   Neurologic:  Alert and  oriented x4;  grossly normal neurologically.  Impression/Plan: Ronald Duarte is here for an endoscopy and colonoscopy to be performed for GERD and history of colon polyps 2015 in [place]  Risks, benefits, limitations, and alternatives regarding  endoscopy and colonoscopy have been reviewed with the patient.  Questions have been answered.  All parties agreeable.   Lucilla Lame, MD  09/25/2018, 9:02 AM

## 2018-09-25 NOTE — Op Note (Signed)
[facility] Patient Name: Ronald Duarte Procedure Date: 09/25/2018 9:06 AM MRN: [id number] Account #: [id number] Date of Birth: 04/03/1946 Admit Type: Outpatient Age: 73 Room: [facility] ENDO ROOM 4 Gender: Male Note Status: Finalized Procedure:            Upper GI endoscopy Indications:          Heartburn Providers:            Lucilla Lame MD, MD Referring MD:         Ramonita Lab, MD (Referring MD) Medicines:            Propofol per Anesthesia Complications:        No immediate complications. Procedure:            Pre-Anesthesia Assessment:                       - Prior to the procedure, a History and Physical was                        performed, and patient medications and allergies were                        reviewed. The patient's tolerance of previous                        anesthesia was also reviewed. The risks and benefits of                        the procedure and the sedation options and risks were                        discussed with the patient. All questions were                        answered, and informed consent was obtained. Prior                        Anticoagulants: The patient has taken no previous                        anticoagulant or antiplatelet agents. ASA Grade                        Assessment: II - A patient with mild systemic disease.                        After reviewing the risks and benefits, the patient was                        deemed in satisfactory condition to undergo the                        procedure.                       After obtaining informed consent, the endoscope was                        passed under direct vision. Throughout the procedure,  the patient's blood pressure, pulse, and oxygen                        saturations were monitored continuously. The Endoscope                        was introduced through the mouth, and advanced to the                        second  part of duodenum. The upper GI endoscopy was                        accomplished without difficulty. The patient tolerated                        the procedure well. Findings:      A medium-sized hiatal hernia was present.      LA Grade C (one or more mucosal breaks continuous between tops of 2 or       more mucosal folds, less than 75% circumference) esophagitis with       bleeding was found in the lower third of the esophagus.      The stomach was normal.      The examined duodenum was normal. Impression:           - Medium-sized hiatal hernia.                       - LA Grade C reflux esophagitis.                       - Normal stomach.                       - Normal examined duodenum.                       - No specimens collected. Recommendation:       - Discharge patient to home.                       - Resume previous diet.                       - Continue present medications.                       - Perform a colonoscopy today. Procedure Code(s):    --- Professional ---                       [phone], Esophagogastroduodenoscopy, flexible, transoral;                        diagnostic, including collection of specimen(s) by                        brushing or washing, when performed (separate procedure) Diagnosis Code(s):    --- Professional ---                       R12, Heartburn                       K21.0, Gastro-esophageal reflux disease with  esophagitis CPT copyright 2018 American Medical Association. All rights reserved. The codes documented in this report are preliminary and upon coder review may  be revised to meet current compliance requirements. Lucilla Lame MD, MD 09/25/2018 9:15:22 AM This report has been signed electronically. Number of Addenda: 0 Note Initiated On: 09/25/2018 9:06 AM      [facility]

## 2018-09-25 NOTE — Op Note (Signed)
[facility]astroenterology Patient Name: Ronald Duarte Procedure Date: 09/25/2018 9:06 AM MRN: [id number] Account #: [id number] Date of Birth: 1945-11-06 Admit Type: Outpatient Age: 73 Room: [facility] ENDO ROOM 4 Gender: Male Note Status: Finalized Procedure:            Colonoscopy Indications:          High risk colon cancer surveillance: Personal history                        of colonic polyps Providers:            Lucilla Lame MD, MD Referring MD:         Ramonita Lab, MD (Referring MD) Medicines:            Propofol per Anesthesia Complications:        No immediate complications. Procedure:            Pre-Anesthesia Assessment:                       - Prior to the procedure, a History and Physical was                        performed, and patient medications and allergies were                        reviewed. The patient's tolerance of previous                        anesthesia was also reviewed. The risks and benefits of                        the procedure and the sedation options and risks were                        discussed with the patient. All questions were                        answered, and informed consent was obtained. Prior                        Anticoagulants: The patient has taken no previous                        anticoagulant or antiplatelet agents. ASA Grade                        Assessment: II - A patient with mild systemic disease.                        After reviewing the risks and benefits, the patient was                        deemed in satisfactory condition to undergo the                        procedure.                       After obtaining informed consent, the colonoscope was  passed under direct vision. Throughout the procedure,                        the patient's blood pressure, pulse, and oxygen                        saturations were monitored continuously. The                        Colonoscope was  introduced through the anus and                        advanced to the the cecum, identified by appendiceal                        orifice and ileocecal valve. The colonoscopy was                        performed without difficulty. The patient tolerated the                        procedure well. The quality of the bowel preparation                        was excellent. Findings:      The perianal and digital rectal examinations were normal.      A 3 mm polyp was found in the rectum. The polyp was sessile. The polyp       was removed with a cold biopsy forceps. Resection and retrieval were       complete.      A 5 mm polyp was found in the descending colon. The polyp was sessile.       The polyp was removed with a cold biopsy forceps. Resection and       retrieval were complete.      Multiple small-mouthed diverticula were found in the sigmoid colon.      Non-bleeding internal hemorrhoids were found during retroflexion. The       hemorrhoids were Grade II (internal hemorrhoids that prolapse but reduce       spontaneously). Impression:           - One 3 mm polyp in the rectum, removed with a cold                        biopsy forceps. Resected and retrieved.                       - One 5 mm polyp in the descending colon, removed with                        a cold biopsy forceps. Resected and retrieved.                       - Diverticulosis in the sigmoid colon.                       - Non-bleeding internal hemorrhoids. Recommendation:       - Discharge patient to home.                       - Resume  previous diet.                       - Continue present medications.                       - Await pathology results.                       - Repeat colonoscopy in 5 years for surveillance. Procedure Code(s):    --- Professional ---                       [phone], Colonoscopy, flexible; with biopsy, single or                        multiple Diagnosis Code(s):    --- Professional ---                        Z86.010, Personal history of colonic polyps                       D12.4, Benign neoplasm of descending colon                       K62.1, Rectal polyp CPT copyright 2018 American Medical Association. All rights reserved. The codes documented in this report are preliminary and upon coder review may  be revised to meet current compliance requirements. Lucilla Lame MD, MD 09/25/2018 9:32:14 AM This report has been signed electronically. Number of Addenda: 0 Note Initiated On: 09/25/2018 9:06 AM Scope Withdrawal Time: 0 hours 8 minutes 54 seconds  Total Procedure Duration: 0 hours 12 minutes 21 seconds       [facility]

## 2018-09-26 ENCOUNTER — Encounter: Payer: Self-pay | Admitting: Gastroenterology

## 2018-09-26 LAB — SURGICAL PATHOLOGY

## 2018-09-27 ENCOUNTER — Encounter: Payer: Self-pay | Admitting: Gastroenterology

## 2018-12-18 ENCOUNTER — Encounter: Payer: Self-pay | Admitting: *Deleted

## 2018-12-18 ENCOUNTER — Other Ambulatory Visit: Payer: Self-pay

## 2018-12-18 ENCOUNTER — Encounter: Payer: Medicare Other | Attending: Internal Medicine | Admitting: *Deleted

## 2018-12-18 VITALS — BP 132/90 | Ht 69.0 in | Wt 242.9 lb

## 2018-12-18 DIAGNOSIS — Z713 Dietary counseling and surveillance: Secondary | ICD-10-CM | POA: Insufficient documentation

## 2018-12-18 DIAGNOSIS — E119 Type 2 diabetes mellitus without complications: Secondary | ICD-10-CM | POA: Diagnosis not present

## 2018-12-18 DIAGNOSIS — Z6835 Body mass index (BMI) 35.0-35.9, adult: Secondary | ICD-10-CM | POA: Diagnosis not present

## 2018-12-18 NOTE — Patient Instructions (Signed)
Check blood sugars 1 x day before breakfast or 2 hrs after supper every day Bring blood sugar records to the next class  Exercise: Walk as tolerated  Eat 3 meals day,  1-2  snacks a day Space meals 4-6 hours apart Allow 2-3 hours between meals and snacks Limit desserts/sweets  Return for classes on:

## 2018-12-18 NOTE — Progress Notes (Signed)
Diabetes Self-Management Education  Visit Type: First/Initial  Appt. Start Time: 0900 Appt. End Time: 1000  12/18/2018  Ronald Duarte, identified by name and date of birth, is a 73 y.o. male with a diagnosis of Diabetes: Type 2.   ASSESSMENT  Blood pressure 132/90, height 5\' 9"  (1.753 m), weight 242 lb 14.4 oz (110.2 kg). Body mass index is 35.87 kg/m.  Diabetes Self-Management Education - 12/18/18 1016      Visit Information   Visit Type  First/Initial      Initial Visit   Diabetes Type  Type 2    Are you currently following a meal plan?  No    Are you taking your medications as prescribed?  Yes    Date Diagnosed  8 years ago      Health Coping   How would you rate your overall health?  Good      Psychosocial Assessment   Patient Belief/Attitude about Diabetes  Other (comment)   "nuisance"   Self-care barriers  None    Self-management support  Doctor's office;Family    Patient Concerns  Nutrition/Meal planning;Glycemic Control;Weight Control;Monitoring;Healthy Lifestyle    Special Needs  None    Preferred Learning Style  Auditory    Learning Readiness  Change in progress    How often do you need to have someone help you when you read instructions, pamphlets, or other written materials from your doctor or pharmacy?  1 - Never    What is the last grade level you completed in school?  minister      Pre-Education Assessment   Patient understands the diabetes disease and treatment process.  Needs Review    Patient understands incorporating nutritional management into lifestyle.  Needs Instruction    Patient undertands incorporating physical activity into lifestyle.  Needs Instruction    Patient understands using medications safely.  Needs Review    Patient understands monitoring blood glucose, interpreting and using results  Needs Review    Patient understands prevention, detection, and treatment of acute complications.  Needs Instruction    Patient understands  prevention, detection, and treatment of chronic complications.  Needs Review    Patient understands how to develop strategies to address psychosocial issues.  Needs Review    Patient understands how to develop strategies to promote health/change behavior.  Needs Review      Complications   Last HgB A1C per patient/outside source  7 %   11/29/2018   How often do you check your blood sugar?  1-2 times/day    Fasting Blood glucose range (mg/dL)  70-129   Pt reports FBG's 90-105 mg/dL   Postprandial Blood glucose range (mg/dL)  70-129   Pt reports pp's 90-120 mg/dL.    Have you had a dilated eye exam in the past 12 months?  No    Have you had a dental exam in the past 12 months?  Yes    Are you checking your feet?  Yes    How many days per week are you checking your feet?  3      Dietary Intake   Breakfast  eggs, whole wheat bread, oatmeal with nuts, dry cereal weekly with nuts    Snack (morning)  apple, grapes, bread, crackers, nuts, cottage cheese, popcorn    Lunch  left-overs or sandwich or sardines, beanie weenies, soup    Snack (afternoon)  same as morning    Dinner  meat and vegetables; beef, chicken, fish, little pork with sweet potatoes, peas,  beans, brown rice, pasta, salads, broccoli, cauliflower    Snack (evening)  same as morning    Beverage(s)  water, coffee, sugar free Gatorade      Exercise   Exercise Type  ADL's      Patient Education   Previous Diabetes Education  No    Disease state   Definition of diabetes, type 1 and 2, and the diagnosis of diabetes;Explored patient's options for treatment of their diabetes    Nutrition management   Role of diet in the treatment of diabetes and the relationship between the three main macronutrients and blood glucose level;Food label reading, portion sizes and measuring food.;Reviewed blood glucose goals for pre and post meals and how to evaluate the patients' food intake on their blood glucose level.    Physical activity and exercise    Role of exercise on diabetes management, blood pressure control and cardiac health.    Medications  Reviewed patients medication for diabetes, action, purpose, timing of dose and side effects.    Monitoring  Purpose and frequency of SMBG.;Taught/discussed recording of test results and interpretation of SMBG.;Identified appropriate SMBG and/or A1C goals.    Chronic complications  Relationship between chronic complications and blood glucose control    Psychosocial adjustment  Identified and addressed patients feelings and concerns about diabetes      Individualized Goals (developed by patient)   Reducing Risk Improve blood sugars Prevent diabetes complications Lose weight Lead a healthier lifestyle     Outcomes   Expected Outcomes  Demonstrated interest in learning. Expect positive outcomes    Future DMSE  2 wks       Individualized Plan for Diabetes Self-Management Training:   Learning Objective:  Patient will have a greater understanding of diabetes self-management. Patient education plan is to attend individual and/or group sessions per assessed needs and concerns.   Plan:   Patient Instructions  Check blood sugars 1 x day before breakfast or 2 hrs after supper every day Bring blood sugar records to the next class Exercise: Walk as tolerated Eat 3 meals day,  1-2  snacks a day Space meals 4-6 hours apart Allow 2-3 hours between meals and snacks Limit desserts/sweets   Expected Outcomes:  Demonstrated interest in learning. Expect positive outcomes  Education material provided:  General Meal Planning Guidelines Simple Meal Plan  If problems or questions, patient to contact team via:  Ronald Drilling, RN, [place], CDE [phone]  Future DSME appointment: 2 wks  December 27, 2018 for Diabetes Class 1

## 2018-12-27 ENCOUNTER — Encounter: Payer: Medicare Other | Admitting: Dietician

## 2018-12-27 ENCOUNTER — Other Ambulatory Visit: Payer: Self-pay

## 2018-12-27 ENCOUNTER — Encounter: Payer: Self-pay | Admitting: Dietician

## 2018-12-27 VITALS — Wt 239.1 lb

## 2018-12-27 DIAGNOSIS — E119 Type 2 diabetes mellitus without complications: Secondary | ICD-10-CM

## 2018-12-27 DIAGNOSIS — Z713 Dietary counseling and surveillance: Secondary | ICD-10-CM | POA: Diagnosis not present

## 2018-12-27 NOTE — Progress Notes (Signed)
Appt. Start Time: 8:50am Appt. End Time:11:30 am  Class 1 Diabetes Overview - define DM; state own type of DM; identify functions of pancreas and insulin; define insulin deficiency vs insulin resistance  Psychosocial - identify DM as a source of stress; state the effects of stress on BG control  Nutritional Management - describe effects of food on blood glucose; identify sources of carbohydrate, protein and fat; verbalize the importance of balance meals in controlling blood glucose  Exercise - describe the effects of exercise on blood glucose and importance of regular exercise in controlling diabetes; state a plan for personal exercise; verbalize contraindications for exercise  Self-Monitoring - state importance of SMBG; use SMBG results to effectively manage diabetes; identify importance of regular HbA1C testing and goals for results  Acute Complications - recognize hyperglycemia and hypoglycemia with causes and effects; identify blood glucose results as high, low or in control; list steps in treating and preventing high and low blood glucose  Chronic Complications/Foot, Skin, Eye Dental Care - identify possible long-term complications of diabetes (retinopathy, neuropathy, nephropathy, cardiovascular disease, infections); state importance of daily self-foot exams; describe how to examine feet and what to look for; explain appropriate eye and dental care  Lifestyle Changes/Goals & Health/Community Resources - state benefits of making appropriate lifestyle changes; identify habits that need to change (meals, tobacco, alcohol); identify strategies to reduce risk factors for personal health  Pregnancy/Sexual Health - define gestational diabetes; state importance of good blood glucose control and birth control prior to pregnancy; state importance of good blood glucose control in preventing sexual problems (impotence, vaginal dryness, infections, loss of desire); state relationship of blood glucose  control and pregnancy outcome; describe risk of maternal and fetal complications  Teaching Materials Used: Class 1 Slides/Notebook Diabetes Booklet ID Card  Medic Alert/Medic ID Forms Sleep Evaluation Exercise Handout Planning a Balanced Meal Goals for Class 1

## 2019-01-03 ENCOUNTER — Encounter: Payer: Medicare Other | Attending: Internal Medicine | Admitting: *Deleted

## 2019-01-03 ENCOUNTER — Other Ambulatory Visit: Payer: Self-pay

## 2019-01-03 ENCOUNTER — Encounter: Payer: Self-pay | Admitting: *Deleted

## 2019-01-03 VITALS — Wt 237.1 lb

## 2019-01-03 DIAGNOSIS — Z6835 Body mass index (BMI) 35.0-35.9, adult: Secondary | ICD-10-CM | POA: Insufficient documentation

## 2019-01-03 DIAGNOSIS — Z713 Dietary counseling and surveillance: Secondary | ICD-10-CM | POA: Diagnosis not present

## 2019-01-03 DIAGNOSIS — E119 Type 2 diabetes mellitus without complications: Secondary | ICD-10-CM | POA: Diagnosis not present

## 2019-01-03 NOTE — Progress Notes (Signed)

## 2019-01-10 ENCOUNTER — Other Ambulatory Visit: Payer: Self-pay

## 2019-01-10 ENCOUNTER — Encounter: Payer: Self-pay | Admitting: Dietician

## 2019-01-10 ENCOUNTER — Encounter: Payer: Medicare Other | Admitting: Dietician

## 2019-01-10 VITALS — BP 124/80 | Wt 234.6 lb

## 2019-01-10 DIAGNOSIS — Z713 Dietary counseling and surveillance: Secondary | ICD-10-CM | POA: Diagnosis not present

## 2019-01-10 DIAGNOSIS — E119 Type 2 diabetes mellitus without complications: Secondary | ICD-10-CM

## 2019-01-10 NOTE — Progress Notes (Signed)
Appt. Start Time: 9:00am Appt. End Time: 12:00  Class 3 Diabetes Overview - identify functions of pancreas and insulin; define insulin deficiency vs insulin resistance  Medications - state name, dose, timing of currently prescribed medications; describe types of medications available for diabetes  Psychosocial - identify DM as a source of stress; state the effects of stress on BG control; verbalize appropriate stress management techniques; identify personal stress issues   Nutritional Management - use food labels to identify serving size, content of carbohydrate, fiber, protein, fat, saturated fat and sodium; recognize food sources of fat, saturated fat, trans fat, and sodium, and verbalize goals for intake; describe healthful, appropriate food choices when dining out   Exercise - state a plan for personal exercise; verbalize contraindications for exercise  Self-Monitoring - state importance of SMBG; use SMBG results to effectively manage diabetes; identify importance of regular HbA1C testing and goals for results  Acute Complications - recognize hyperglycemia and hypoglycemia with causes and effects; identify blood glucose results as high, low or in control; list steps in treating and preventing high and low blood glucose  Chronic Complications - state importance of daily self-foot exams; explain appropriate eye and dental care  Lifestyle Changes/Goals & Health/Community Resources - set goals for proper diabetes care; state need for and frequency of healthcare follow-up; describe appropriate community resources for good health (ADA, web sites, apps)   Teaching Materials Used: Class 3 Slide Packet Diabetes Stress Test Stress Management Tools Stress Poem Goal Setting Worksheet Website/App List    

## 2019-01-14 ENCOUNTER — Encounter: Payer: Self-pay | Admitting: *Deleted

## 2019-06-12 DIAGNOSIS — D696 Thrombocytopenia, unspecified: Secondary | ICD-10-CM | POA: Insufficient documentation

## 2019-08-20 ENCOUNTER — Encounter: Payer: Self-pay | Admitting: *Deleted

## 2020-07-06 DIAGNOSIS — D6869 Other thrombophilia: Secondary | ICD-10-CM | POA: Insufficient documentation

## 2022-07-27 ENCOUNTER — Ambulatory Visit
Admission: RE | Admit: 2022-07-27 | Discharge: 2022-07-27 | Disposition: A | Discharge status: Home / Self Care | Payer: Medicare Other | Source: Ambulatory Visit | Attending: *Deleted | Admitting: *Deleted

## 2022-07-27 ENCOUNTER — Ambulatory Visit
Admission: RE | Admit: 2022-07-27 | Discharge: 2022-07-27 | Disposition: A | Discharge status: Home / Self Care | Payer: Medicare Other | Attending: Physician Assistant | Admitting: Physician Assistant

## 2022-07-27 ENCOUNTER — Other Ambulatory Visit: Payer: Self-pay | Admitting: *Deleted

## 2022-07-27 DIAGNOSIS — L299 Pruritus, unspecified: Secondary | ICD-10-CM

## 2022-08-08 ENCOUNTER — Other Ambulatory Visit: Payer: Self-pay | Admitting: Internal Medicine

## 2022-08-08 DIAGNOSIS — R911 Solitary pulmonary nodule: Secondary | ICD-10-CM

## 2022-08-23 ENCOUNTER — Ambulatory Visit
Admission: RE | Admit: 2022-08-23 | Discharge: 2022-08-23 | Disposition: A | Discharge status: Home / Self Care | Payer: Medicare Other | Source: Ambulatory Visit | Attending: Internal Medicine | Admitting: Internal Medicine

## 2022-08-23 DIAGNOSIS — R911 Solitary pulmonary nodule: Secondary | ICD-10-CM | POA: Insufficient documentation

## 2022-08-23 MED ORDER — IOHEXOL 300 MG/ML  SOLN
75.0000 mL | Freq: Once | INTRAMUSCULAR | Status: AC | PRN
  Administered 2022-08-23: 75 mL via INTRAVENOUS

## 2022-08-24 DIAGNOSIS — I7121 Aneurysm of the ascending aorta, without rupture: Secondary | ICD-10-CM | POA: Insufficient documentation

## 2023-03-04 ENCOUNTER — Encounter: Payer: Self-pay | Admitting: Emergency Medicine

## 2023-03-04 ENCOUNTER — Ambulatory Visit
Admission: EM | Admit: 2023-03-04 | Discharge: 2023-03-04 | Disposition: A | Discharge status: Home / Self Care | Payer: Medicare PPO

## 2023-03-04 DIAGNOSIS — L299 Pruritus, unspecified: Secondary | ICD-10-CM

## 2023-03-04 DIAGNOSIS — D7219 Other eosinophilia: Secondary | ICD-10-CM

## 2023-03-04 HISTORY — DX: Aneurysm of the ascending aorta, without rupture: I71.21

## 2023-03-04 MED ORDER — HYDROXYZINE HCL 25 MG PO TABS: 25.0000 mg | ORAL_TABLET | Freq: Four times a day (QID) | ORAL | 0 refills | Status: AC

## 2023-03-04 MED ORDER — TRIAMCINOLONE ACETONIDE 0.1 % EX CREA: 1.0000 | TOPICAL_CREAM | Freq: Two times a day (BID) | CUTANEOUS | 0 refills | Status: AC

## 2023-03-04 NOTE — ED Triage Notes (Signed)
Chronic itching, worse today. Hx of allergy injections in youth. 2019 began itching daily, no rash involvement. Takes numerous zyrtec and benadryl daily for management. This morning sensation is worse than normal. Has appointment with allergist approaching soon.

## 2023-03-04 NOTE — Discharge Instructions (Addendum)
Follow up with your allergist. 

## 2023-03-04 NOTE — ED Provider Notes (Signed)
Ronald Duarte    CSN: [id number] Arrival date & time: 03/04/23  7829      History   Chief Complaint Chief Complaint  Patient presents with   Pruritis    HPI Ronald Duarte is a 77 y.o. male.   HPI  Presents to urgent care with complaint of chronic itching which is acutely worse this morning.  Patient states he has been itching since 2019.  No rash.  Uses Zyrtec and Benadryl daily.  He states his symptoms this morning are much worse than normal.  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  Review of the patient's meds was performed and changed from pantoprazole to esomeprazole had no improvement in symptoms.  Patient denies any current changes in medications or use of new products that could trigger his acutely worsening symptoms.  He has an appointment with his allergist in 5 days and was instructed to not use any antihistamine medications within 3 days (Monday to Thursday) of the visit.  Past Medical History:  Diagnosis Date   Ascending aortic aneurysm (HCC)    BPH (benign prostatic hyperplasia)    Bronchitis    Diabetes mellitus without complication (HCC)    DVT (deep venous thrombosis) (HCC)    H/O LEFT LEG   Elevated lipids    GERD (gastroesophageal reflux disease)    History of hiatal hernia    HOH (hard of hearing)    Hypertension    Lower extremity edema    Multiple gastric ulcers    Neuropathy    MILD IN RIGHT LEG   Pericarditis 1989   Stroke [facility])    TIA 1987   TIA (transient ischemic attack)     Patient Active Problem List   Diagnosis Date Noted   Reflux esophagitis    Rectal polyp    Benign neoplasm of descending colon    Cellulitis and abscess of toe of left foot 09/01/2016   Coronary artery disease involving native coronary artery of native heart without angina pectoris 08/23/2016   Gastroesophageal reflux disease without esophagitis 08/23/2016   History of DVT of lower extremity 08/23/2016    History of pericarditis 08/23/2016   Hypertension, essential 08/23/2016   Lower urinary tract symptoms (LUTS) 08/23/2016   Other hyperlipidemia 08/23/2016   Type 2 diabetes mellitus without complication, without long-term current use of insulin (HCC) 08/23/2016    Past Surgical History:  Procedure Laterality Date   CARDIAC CATHETERIZATION     X 2   CATARACT EXTRACTION W/PHACO Right 06/28/2016   Procedure: CATARACT EXTRACTION PHACO AND INTRAOCULAR LENS PLACEMENT (IOC);  Surgeon: Galen Manila, MD;  Location: [facility] ORS;  Service: Ophthalmology;  Laterality: Right;  [place] 1.02 AP% 21.7 CDE 13.51 Fluid pack lot # [id number]   CATARACT EXTRACTION W/PHACO Left 02/14/2017   Procedure: CATARACT EXTRACTION PHACO AND INTRAOCULAR LENS PLACEMENT (IOC);  Surgeon: Galen Manila, MD;  Location: [facility] ORS;  Service: Ophthalmology;  Laterality: Left;  [place] 00:44 AP% 11.0  CDE 4.82 Fluid pack lot # [id number]   COLONOSCOPY WITH PROPOFOL N/A 09/25/2018   Procedure: COLONOSCOPY WITH PROPOFOL;  Surgeon: Midge Minium, MD;  Location: [facility] ENDOSCOPY;  Service: Endoscopy;  Laterality: N/A;   ESOPHAGOGASTRODUODENOSCOPY (EGD) WITH PROPOFOL N/A 09/25/2018   Procedure: ESOPHAGOGASTRODUODENOSCOPY (EGD) WITH PROPOFOL;  Surgeon: Midge Minium, MD;  Location: [facility] ENDOSCOPY;  Service: Endoscopy;  Laterality: N/A;   KNEE ARTHROSCOPY  2014/2016   toenail removed         Home Medications  Prior to Admission medications   Medication Sig Start Date End Date Taking? Authorizing Provider  albuterol (PROVENTIL HFA;VENTOLIN HFA) 108 (90 Base) MCG/ACT inhaler Inhale 2 puffs into the lungs every 6 (six) hours as needed for wheezing or shortness of breath.   Yes [provider]  Biotin 16109 MCG TABS Take 10,000 mcg by mouth daily with supper.   Yes [provider]  cetirizine (ZYRTEC) 10 MG tablet Take 10 mg by mouth daily.    Yes [provider]  Coenzyme Q10 (COQ10) 200 MG CAPS Take 200 mg by  mouth daily with supper.   Yes [provider]  diphenhydrAMINE (BENADRYL) 25 mg capsule Take 25 mg by mouth every 6 (six) hours as needed.   Yes [provider]  dutasteride (AVODART) 0.5 MG capsule Take 0.5 mg by mouth at bedtime.   Yes [provider]  dutasteride (AVODART) 0.5 MG capsule Take by mouth. 02/06/23  Yes [provider]  esomeprazole (NEXIUM) 40 MG capsule Take 40 mg by mouth daily.   Yes [provider]  ezetimibe (ZETIA) 10 MG tablet Take 10 mg by mouth at bedtime.   Yes [provider]  folic acid (FOLVITE) 400 MCG tablet Take 400 mcg by mouth daily with supper.   Yes [provider]  ibuprofen (ADVIL,MOTRIN) 200 MG tablet Take 200 mg by mouth every 8 (eight) hours as needed (for pain (seldom)).   Yes [provider]  metFORMIN (GLUCOPHAGE) 500 MG tablet Take 500 mg by mouth 3 (three) times daily with meals.   Yes [provider]  metroNIDAZOLE (METROCREAM) 0.75 % cream Apply topically. 02/13/23  Yes [provider]  nebivolol (BYSTOLIC) 2.5 MG tablet Take 2.5 mg by mouth every evening.    Yes [provider]  rivaroxaban (XARELTO) 20 MG TABS tablet Take 20 mg by mouth daily after supper.   Yes [provider]  rosuvastatin (CRESTOR) 20 MG tablet Take 20 mg by mouth at bedtime.   Yes [provider]  traZODone (DESYREL) 50 MG tablet Take 50 mg by mouth at bedtime.   Yes [provider]  pantoprazole (PROTONIX) 40 MG tablet Take 1 tablet (40 mg total) by mouth daily. 09/25/18   Midge Minium, MD    Family History Family History  Problem Relation Age of Onset   Prostate cancer Neg Hx    Hematuria Neg Hx    Sickle cell anemia Neg Hx    Tuberculosis Neg Hx    Bladder Cancer Neg Hx    Kidney cancer Neg Hx     Social History Social History   Tobacco Use   Smoking status: Never   Smokeless tobacco: Never  Substance Use Topics   Alcohol use: Yes     Alcohol/week: 1.0 standard drink of alcohol    Types: 1 Glasses of wine per week   Drug use: No     Allergies   Patient has no known allergies.   Review of Systems Review of Systems   Physical Exam Triage Vital Signs ED Triage Vitals  Enc Vitals Group     BP 03/04/23 0830 (!) 160/89     Pulse Rate 03/04/23 0830 79     Resp 03/04/23 0830 16     Temp 03/04/23 0830 99.3 F (37.4 C)     Temp Source 03/04/23 0830 Oral     SpO2 03/04/23 0830 96 %     Weight --      Height --  Head Circumference --      Peak Flow --      Pain Score 03/04/23 0831 0     Pain Loc --      Pain Edu? --      Excl. in GC? --    No data found.  Updated Vital Signs BP (!) 160/89 (BP Location: Left Arm)   Pulse 79   Temp 99.3 F (37.4 C) (Oral)   Resp 16   SpO2 96%   Visual Acuity Right Eye Distance:   Left Eye Distance:   Bilateral Distance:    Right Eye Near:   Left Eye Near:    Bilateral Near:     Physical Exam Vitals reviewed.  Constitutional:      Appearance: Normal appearance.  Skin:    General: Skin is warm and dry.     Findings: No erythema or rash.     Comments: Patient has symptoms of pruritus from his knees to his shoulders with constant rubbing and scratching of his body without relief.  There is no rash, or erythema.  Neurological:     General: No focal deficit present.     Mental Status: He is alert and oriented to person, place, and time.  Psychiatric:        Mood and Affect: Mood normal.        Behavior: Behavior normal.      UC Treatments / Results  Labs (all labs ordered are listed, but only abnormal results are displayed) Labs Reviewed - No data to display  EKG   Radiology No results found.  Procedures Procedures (including critical care time)  Medications Ordered in UC Medications - No data to display  Initial Impression / Assessment and Plan / UC Course  I have reviewed the triage vital signs and the nursing notes.  Pertinent labs &  imaging results that were available during my care of the patient were reviewed by me and considered in my medical decision making (see chart for details).   Ronald Duarte is a 77 y.o. male presenting with pruritus. Patient is afebrile without recent antipyretics, satting well on room air. Overall is well appearing, well hydrated, without respiratory distress.   Reviewed relevant chart history.  Am eager to attempt to provide some relief to this patient who states that he is "likely to go crazy" from the pruritus.  Discussed treatment options with the patient and his wife.  He no longer has any relief from use of Benadryl and while I am doubtful that hydroxyzine will give him any improvement, I am willing to try a prescription.  Reminded the patient that his allergist gave instructions to not use any antihistamines starting Monday, so his use would be limited to today and tomorrow.  We discussed treatment with corticosteroids however the patient also has DM 2 with blood sugar currently very well-controlled.  Informed him of that oral corticosteroids would likely cause his blood sugar to rise at least temporarily, and I was unclear whether it would be effective.  In addition, using a corticosteroid so soon before his allergy testing would likely be at cross purposes with the allergy evaluation.  We discussed treatment with topical corticosteroid which would have much more limited systemic effect and might give some relief, however he would need to apply the lotion/cream across a significant portion of his torso and legs.  Warned him to avoid his groin area and face.  Also warned him that long-term use of steroids would thin his  skin.  Will prescribe a limited amount of hydroxyzine for him to try as well as triamcinolone cream.  He will follow-up with his allergy provider at his appointment next week.  Counseled patient on potential for adverse effects with medications prescribed/recommended  today, ER and return-to-clinic precautions discussed, patient verbalized understanding and agreement with care plan.   Final Clinical Impressions(s) / UC Diagnoses   Final diagnoses:  None   Discharge Instructions   None    ED Prescriptions   None    PDMP not reviewed this encounter.   Charma Igo, [place] 03/04/23 [phone]

## 2023-03-24 ENCOUNTER — Inpatient Hospital Stay: Payer: Medicare PPO | Attending: Internal Medicine | Admitting: Internal Medicine

## 2023-03-24 ENCOUNTER — Inpatient Hospital Stay: Payer: Medicare PPO

## 2023-03-24 VITALS — BP 140/82 | HR 72 | Temp 97.9°F | Wt 219.8 lb

## 2023-03-24 DIAGNOSIS — D721 Eosinophilia, unspecified: Secondary | ICD-10-CM | POA: Diagnosis present

## 2023-03-24 DIAGNOSIS — L299 Pruritus, unspecified: Secondary | ICD-10-CM | POA: Insufficient documentation

## 2023-03-24 LAB — VITAMIN B12: Vitamin B-12: 312 pg/mL (ref 180–914)

## 2023-03-24 LAB — CBC WITH DIFFERENTIAL/PLATELET
Abs Immature Granulocytes: 0.07 10*3/uL (ref 0.00–0.07)
Band Neutrophils: 0 %
Basophils Absolute: 0.1 10*3/uL (ref 0.0–0.1)
Basophils Relative: 1 %
Blasts: 0 %
Eosinophils Absolute: 0.8 10*3/uL — ABNORMAL HIGH (ref 0.0–0.5)
Eosinophils Relative: 12 %
HCT: 47.5 % (ref 39.0–52.0)
Hemoglobin: 15 g/dL (ref 13.0–17.0)
Lymphocytes Relative: 33 %
Lymphs Abs: 2.3 10*3/uL (ref 0.7–4.0)
MCH: 30.4 pg (ref 26.0–34.0)
MCHC: 31.6 g/dL (ref 30.0–36.0)
MCV: 96.2 fL (ref 80.0–100.0)
Metamyelocytes Relative: 0 %
Monocytes Absolute: 0.6 10*3/uL (ref 0.1–1.0)
Monocytes Relative: 8 %
Myelocytes: 1 %
Neutro Abs: 3.1 10*3/uL (ref 1.7–7.7)
Neutrophils Relative %: 45 %
Other: 0 %
Platelets: 209 10*3/uL (ref 150–400)
Promyelocytes Relative: 0 %
RBC: 4.94 MIL/uL (ref 4.22–5.81)
RDW: 13.7 % (ref 11.5–15.5)
WBC: 7 10*3/uL (ref 4.0–10.5)
nRBC: 0 % (ref 0.0–0.2)
nRBC: 0 /100 WBC

## 2023-03-24 LAB — TECHNOLOGIST SMEAR REVIEW
Plt Morphology: NORMAL
RBC MORPHOLOGY: NORMAL
WBC MORPHOLOGY: NORMAL

## 2023-03-24 NOTE — Progress Notes (Signed)
[facility]  Telephone:([phone]) [phone] Fax:(336) [phone]  ID: Ronald Duarte OB: May 19, 1946  MR#: [id number]  GLO#:[id number]  Patient Care Team: Lynnea Ferrier, MD as PCP - General (Internal Medicine)  REFERRING PROVIDER: Dr. Graciela Husbands  REASON FOR REFERRAL: Eosinophilia  HPI: Ronald Duarte is a 77 y.o. male with past medical history of GERD, ascending aortic aneurysm, bronchitis, hypertension, pericarditis, stroke was referred to hematology for workup of eosinophilia.  On lab review, patient has elevated eosinophil count since 2019.  There has been a very gradual increase.  In 2019 8 AEC was around 0.57.  From 12/2021 AEC 0.81 from 07/2022 AEC 1000.  From 12/2022 AEC 0.92 with mild basophilia of 0.12.  Hemoglobin and platelets are normal.  WBC count is normal.  Patient reports history of itching since 2019 which initially would happen post shower.  But slowly it has been progressive and now he has diffuse itching almost on regular basis without any precipitating factor.  Does not see any visible rash.  Reports he was evaluated by allergist with  I am not able to find any records and had extensive testing.  Reportedly, all testing for environmental allergies were negative.  Previously was taking Zyrtec and now was changed to Allegra by his primary.  Does not think antihistamines are helping as much as they did previously.  Denies any shortness of breath, chest pain, abdominal pain, flushing, urticaria, cough.  Denies any cardiac conditions. Has is having headache and dizziness for about a week after he started doxepin. Reports chronic wheezing and is on albuterol.  Has history of bronchitis in the past  CT chest with contrast done on December 2023 showed ascending thoracic aneurysm 4.2 cm, moderate size hiatal hernia, minimal atelectasis in lower lobe, pulmonary infiltrate, pleural effusion or pneumothorax.  Hepatic cyst no follow-up  recommended.  REVIEW OF SYSTEMS:   ROS  As per HPI. Otherwise, a complete review of systems is negative.  PAST MEDICAL HISTORY: Past Medical History:  Diagnosis Date   Ascending aortic aneurysm (HCC)    BPH (benign prostatic hyperplasia)    Bronchitis    Diabetes mellitus without complication (HCC)    DVT (deep venous thrombosis) (HCC)    H/O LEFT LEG   Elevated lipids    GERD (gastroesophageal reflux disease)    History of hiatal hernia    HOH (hard of hearing)    Hypertension    Lower extremity edema    Multiple gastric ulcers    Neuropathy    MILD IN RIGHT LEG   Pericarditis 1989   Stroke [facility])    TIA 1987   TIA (transient ischemic attack)     PAST SURGICAL HISTORY: Past Surgical History:  Procedure Laterality Date   CARDIAC CATHETERIZATION     X 2   CATARACT EXTRACTION W/PHACO Right 06/28/2016   Procedure: CATARACT EXTRACTION PHACO AND INTRAOCULAR LENS PLACEMENT (IOC);  Surgeon: Galen Manila, MD;  Location: [facility] ORS;  Service: Ophthalmology;  Laterality: Right;  [place] 1.02 AP% 21.7 CDE 13.51 Fluid pack lot # [id number]   CATARACT EXTRACTION W/PHACO Left 02/14/2017   Procedure: CATARACT EXTRACTION PHACO AND INTRAOCULAR LENS PLACEMENT (IOC);  Surgeon: Galen Manila, MD;  Location: [facility] ORS;  Service: Ophthalmology;  Laterality: Left;  [place] 00:44 AP% 11.0  CDE 4.82 Fluid pack lot # [id number]   COLONOSCOPY WITH PROPOFOL N/A 09/25/2018   Procedure: COLONOSCOPY WITH PROPOFOL;  Surgeon: Midge Minium, MD;  Location: [facility] ENDOSCOPY;  Service: Endoscopy;  Laterality: N/A;  ESOPHAGOGASTRODUODENOSCOPY (EGD) WITH PROPOFOL N/A 09/25/2018   Procedure: ESOPHAGOGASTRODUODENOSCOPY (EGD) WITH PROPOFOL;  Surgeon: Midge Minium, MD;  Location: [facility] ENDOSCOPY;  Service: Endoscopy;  Laterality: N/A;   KNEE ARTHROSCOPY  2014/2016   toenail removed      FAMILY HISTORY: Family History  Problem Relation Age of Onset   Prostate cancer Neg Hx    Hematuria Neg Hx    Sickle cell anemia  Neg Hx    Tuberculosis Neg Hx    Bladder Cancer Neg Hx    Kidney cancer Neg Hx     HEALTH MAINTENANCE: Social History   Tobacco Use   Smoking status: Never   Smokeless tobacco: Never  Substance Use Topics   Alcohol use: Yes    Alcohol/week: 1.0 standard drink of alcohol    Types: 1 Glasses of wine per week   Drug use: No     No Known Allergies  Current Outpatient Medications  Medication Sig Dispense Refill   albuterol (PROVENTIL HFA;VENTOLIN HFA) 108 (90 Base) MCG/ACT inhaler Inhale 2 puffs into the lungs every 6 (six) hours as needed for wheezing or shortness of breath.     Biotin 40981 MCG TABS Take 10,000 mcg by mouth daily with supper.     cetirizine (ZYRTEC) 10 MG tablet Take 10 mg by mouth daily.      Coenzyme Q10 (COQ10) 200 MG CAPS Take 200 mg by mouth daily with supper.     diphenhydrAMINE (BENADRYL) 25 mg capsule Take 25 mg by mouth every 6 (six) hours as needed.     doxepin (SINEQUAN) 25 MG capsule Take by mouth.     dutasteride (AVODART) 0.5 MG capsule Take 0.5 mg by mouth at bedtime.     dutasteride (AVODART) 0.5 MG capsule Take by mouth.     esomeprazole (NEXIUM) 40 MG capsule Take 40 mg by mouth daily.     ezetimibe (ZETIA) 10 MG tablet Take 10 mg by mouth at bedtime.     folic acid (FOLVITE) 400 MCG tablet Take 400 mcg by mouth daily with supper.     hydrOXYzine (ATARAX) 25 MG tablet Take 1 tablet (25 mg total) by mouth every 6 (six) hours. 12 tablet 0   ibuprofen (ADVIL,MOTRIN) 200 MG tablet Take 200 mg by mouth every 8 (eight) hours as needed (for pain (seldom)).     metFORMIN (GLUCOPHAGE) 500 MG tablet Take 500 mg by mouth 3 (three) times daily with meals.     metroNIDAZOLE (METROCREAM) 0.75 % cream Apply topically.     nebivolol (BYSTOLIC) 2.5 MG tablet Take 2.5 mg by mouth every evening.      pantoprazole (PROTONIX) 40 MG tablet Take 1 tablet (40 mg total) by mouth daily. 30 tablet 11   rivaroxaban (XARELTO) 20 MG TABS tablet Take 20 mg by mouth daily  after supper.     rosuvastatin (CRESTOR) 20 MG tablet Take 20 mg by mouth at bedtime.     triamcinolone cream (KENALOG) 0.1 % Apply 1 Application topically 2 (two) times daily. Avoid application near the groin and face. 454 g 0   traZODone (DESYREL) 50 MG tablet Take 50 mg by mouth at bedtime. (Patient not taking: Reported on 03/24/2023)     No current facility-administered medications for this visit.    OBJECTIVE: Vitals:   03/24/23 1058 03/24/23 1104  BP: (!) 150/89 (!) 140/82  Pulse: 72   Temp: 97.9 F (36.6 C)   SpO2: 100%      Body mass index is 32.46 kg/m.  General: Well-developed, well-nourished, no acute distress. Eyes: Pink conjunctiva, anicteric sclera. HEENT: Normocephalic, moist mucous membranes, clear oropharnyx. Lungs: Clear to auscultation bilaterally. Heart: Regular rate and rhythm. No rubs, murmurs, or gallops. Abdomen: Soft, nontender, nondistended. No organomegaly noted, normoactive bowel sounds. Musculoskeletal: No edema, cyanosis, or clubbing. Neuro: Alert, answering all questions appropriately. Cranial nerves grossly intact. Skin: No rashes or petechiae noted. Psych: Normal affect. Lymphatics: No cervical, calvicular, axillary or inguinal LAD.   LAB RESULTS:  No results found for: "NA", "K", "CL", "CO2", "GLUCOSE", "BUN", "CREATININE", "CALCIUM", "PROT", "ALBUMIN", "AST", "ALT", "ALKPHOS", "BILITOT", "GFRNONAA", "GFRAA"  No results found for: "WBC", "NEUTROABS", "HGB", "HCT", "MCV", "PLT"  No results found for: "TIBC", "FERRITIN", "IRONPCTSAT"   STUDIES: No results found.  ASSESSMENT AND PLAN:   Ronald Duarte is a 77 y.o. male with pmh of GERD, ascending aortic aneurysm, bronchitis, hypertension, pericarditis, stroke was referred to hematology for workup of eosinophilia.  # Eosinophilia -Chronic: Unclear etiology. On lab review, patient has elevated eosinophil count since 2019.  There has been a very gradual increase.  In 2019, AEC was  around 0.57.  From 12/2021 AEC 0.81 from 07/2022 AEC 1000.  From 12/2022 AEC 0.92 with mild basophilia of 0.12.  Hemoglobin and platelets are normal.  WBC count is normal.  Patient reports history of itching since 2019 which initially would happen post shower.  But slowly it has been progressive and now he has diffuse itching almost on regular basis without any precipitating factor.  Does not see any visible rash.  Reports he was evaluated by allergist with [place] [facility] I am not able to find any records and had extensive testing.  Reportedly, all testing for environmental allergies were negative.  Previously was taking Zyrtec and now was changed to Allegra by his primary.  Does not think antihistamines are helping as much as they did previously.  Discussed with the patient that he does have elevated eosinophil count but the increase has been very gradual and levels have always been 1000 or below.  Patient does report history of itching on regular basis.  No other symptoms that would be concerning for systemic involvement.  Also the chances of hypereosinophilic syndrome is higher when the eosinophil counts are consistently more than 1500 which has not been the case.  I will obtain labs as below to rule out any monoclonal process.  If any abnormality, will consider checking for FLIPI or PDGFR.  Medication list were reviewed and no offending agents were identified.   Orders Placed This Encounter  Procedures   CBC with Differential/Platelet   Tryptase   Vitamin B12   Flow cytometry panel-leukemia/lymphoma work-up   BCR-ABL1 FISH   Technologist smear review   RTC in 10 days for MD visit to discuss labs.  Patient expressed understanding and was in agreement with this plan. He also understands that He can call clinic at any time with any questions, concerns, or complaints.   I spent a total of 45 minutes reviewing chart data, face-to-face evaluation with the patient, counseling and coordination of care as  detailed above.  Michaelyn Barter, MD   03/24/2023 11:12 AM

## 2023-03-24 NOTE — Progress Notes (Signed)
Patient states that he is feeling good

## 2023-04-03 ENCOUNTER — Inpatient Hospital Stay: Payer: Medicare PPO | Attending: Internal Medicine | Admitting: Internal Medicine

## 2023-04-03 VITALS — BP 122/76 | HR 76 | Temp 97.6°F | Wt 220.0 lb

## 2023-04-03 DIAGNOSIS — R519 Headache, unspecified: Secondary | ICD-10-CM | POA: Diagnosis not present

## 2023-04-03 DIAGNOSIS — D721 Eosinophilia, unspecified: Secondary | ICD-10-CM

## 2023-04-03 DIAGNOSIS — Z8673 Personal history of transient ischemic attack (TIA), and cerebral infarction without residual deficits: Secondary | ICD-10-CM | POA: Insufficient documentation

## 2023-04-03 DIAGNOSIS — R42 Dizziness and giddiness: Secondary | ICD-10-CM | POA: Diagnosis not present

## 2023-04-03 DIAGNOSIS — Z79899 Other long term (current) drug therapy: Secondary | ICD-10-CM | POA: Diagnosis not present

## 2023-04-03 NOTE — Progress Notes (Signed)
[facility]  [phone] [phone]  ID: Ronald Duarte OB: [birth year]  MR#: [id number]  AOZ#:[id number]  Patient Care Team: Lynnea Ferrier, MD as PCP - General (Internal Medicine)  REFERRING PROVIDER: Dr. Graciela Husbands  REASON FOR REFERRAL: Eosinophilia  HPI: Ronald Duarte is a 77 y.o. male with past medical history of GERD, ascending aortic aneurysm, bronchitis, hypertension, pericarditis, stroke was referred to hematology for workup of eosinophilia.  On lab review, patient has elevated eosinophil count since 2019.  There has been a very gradual increase.  In 2019 pulm AEC was around 0.57.  From 12/2021 AEC 0.81 from 07/2022 AEC 1000.  From 12/2022 AEC 0.92 with mild basophilia of 0.12.  Hemoglobin and platelets are normal.  WBC count is normal.  Patient reports history of itching since 2019 which initially would happen post shower.  But slowly it has been progressive and now he has diffuse itching almost on regular basis without any precipitating factor.  Does not see any visible rash.  Reports he was evaluated by allergist with [place] I am not able to find any records and had extensive testing.  Reportedly, all testing for environmental allergies were negative.  Previously was taking Zyrtec and now was changed to Allegra by his primary.  Does not think antihistamines are helping as much as they did previously.  Denies any shortness of breath, chest pain, abdominal pain, flushing, urticaria, cough.  Denies any cardiac conditions. Has is having headache and dizziness for about a week after he started doxepin. Reports chronic wheezing and is on albuterol.  Has history of bronchitis in the past  CT chest with contrast done on December 2023 showed ascending thoracic aneurysm 4.2 cm, moderate size hiatal hernia, minimal atelectasis in lower lobe, pulmonary infiltrate, pleural effusion or pneumothorax.  Hepatic cyst no follow-up  recommended.  Interval history Patient was seen today as follow-up to discuss labs. He has been feeling okay overall.  Denies any new changes since the last visit.  REVIEW OF SYSTEMS:   ROS  As per HPI. Otherwise, a complete review of systems is negative.  PAST MEDICAL HISTORY: Past Medical History:  Diagnosis Date   Ascending aortic aneurysm (HCC)    BPH (benign prostatic hyperplasia)    Bronchitis    Diabetes mellitus without complication (HCC)    DVT (deep venous thrombosis) (HCC)    H/O LEFT LEG   Elevated lipids    GERD (gastroesophageal reflux disease)    History of hiatal hernia    HOH (hard of hearing)    Hypertension    Lower extremity edema    Multiple gastric ulcers    Neuropathy    MILD IN RIGHT LEG   Pericarditis 1989   Stroke [facility])    TIA 1987   TIA (transient ischemic attack)     PAST SURGICAL HISTORY: Past Surgical History:  Procedure Laterality Date   CARDIAC CATHETERIZATION     X 2   CATARACT EXTRACTION W/PHACO Right 06/28/2016   Procedure: CATARACT EXTRACTION PHACO AND INTRAOCULAR LENS PLACEMENT (IOC);  Surgeon: Galen Manila, MD;  Location: [facility] ORS;  Service: Ophthalmology;  Laterality: Right;  [place] 1.02 AP% 21.7 CDE 13.51 Fluid pack lot # [id number]   CATARACT EXTRACTION W/PHACO Left 02/14/2017   Procedure: CATARACT EXTRACTION PHACO AND INTRAOCULAR LENS PLACEMENT (IOC);  Surgeon: Galen Manila, MD;  Location: [facility] ORS;  Service: Ophthalmology;  Laterality: Left;  [place] 00:44 AP% 11.0  CDE 4.82 Fluid pack lot # [id number]  COLONOSCOPY WITH PROPOFOL N/A 09/25/2018   Procedure: COLONOSCOPY WITH PROPOFOL;  Surgeon: Midge Minium, MD;  Location: [facility] ENDOSCOPY;  Service: Endoscopy;  Laterality: N/A;   ESOPHAGOGASTRODUODENOSCOPY (EGD) WITH PROPOFOL N/A 09/25/2018   Procedure: ESOPHAGOGASTRODUODENOSCOPY (EGD) WITH PROPOFOL;  Surgeon: Midge Minium, MD;  Location: [facility] ENDOSCOPY;  Service: Endoscopy;  Laterality: N/A;   KNEE ARTHROSCOPY  2014/2016    toenail removed      FAMILY HISTORY: Family History  Problem Relation Age of Onset   Prostate cancer Neg Hx    Hematuria Neg Hx    Sickle cell anemia Neg Hx    Tuberculosis Neg Hx    Bladder Cancer Neg Hx    Kidney cancer Neg Hx     HEALTH MAINTENANCE: Social History   Tobacco Use   Smoking status: Never   Smokeless tobacco: Never  Substance Use Topics   Alcohol use: Yes    Alcohol/week: 1.0 standard drink of alcohol    Types: 1 Glasses of wine per week   Drug use: No     No Known Allergies  Current Outpatient Medications  Medication Sig Dispense Refill   albuterol (PROVENTIL HFA;VENTOLIN HFA) 108 (90 Base) MCG/ACT inhaler Inhale 2 puffs into the lungs every 6 (six) hours as needed for wheezing or shortness of breath.     Biotin 95621 MCG TABS Take 10,000 mcg by mouth daily with supper.     cetirizine (ZYRTEC) 10 MG tablet Take 10 mg by mouth daily.      Coenzyme Q10 (COQ10) 200 MG CAPS Take 200 mg by mouth daily with supper.     diphenhydrAMINE (BENADRYL) 25 mg capsule Take 25 mg by mouth every 6 (six) hours as needed.     doxepin (SINEQUAN) 25 MG capsule Take by mouth.     dutasteride (AVODART) 0.5 MG capsule Take 0.5 mg by mouth at bedtime.     dutasteride (AVODART) 0.5 MG capsule Take by mouth.     esomeprazole (NEXIUM) 40 MG capsule Take 40 mg by mouth daily.     ezetimibe (ZETIA) 10 MG tablet Take 10 mg by mouth at bedtime.     folic acid (FOLVITE) 400 MCG tablet Take 400 mcg by mouth daily with supper.     hydrOXYzine (ATARAX) 25 MG tablet Take 1 tablet (25 mg total) by mouth every 6 (six) hours. 12 tablet 0   ibuprofen (ADVIL,MOTRIN) 200 MG tablet Take 200 mg by mouth every 8 (eight) hours as needed (for pain (seldom)).     metFORMIN (GLUCOPHAGE) 500 MG tablet Take 500 mg by mouth 3 (three) times daily with meals.     metroNIDAZOLE (METROCREAM) 0.75 % cream Apply topically.     nebivolol (BYSTOLIC) 2.5 MG tablet Take 2.5 mg by mouth every evening.       pantoprazole (PROTONIX) 40 MG tablet Take 1 tablet (40 mg total) by mouth daily. 30 tablet 11   rivaroxaban (XARELTO) 20 MG TABS tablet Take 20 mg by mouth daily after supper.     rosuvastatin (CRESTOR) 20 MG tablet Take 20 mg by mouth at bedtime.     triamcinolone cream (KENALOG) 0.1 % Apply 1 Application topically 2 (two) times daily. Avoid application near the groin and face. 454 g 0   traZODone (DESYREL) 50 MG tablet Take 50 mg by mouth at bedtime. (Patient not taking: Reported on 03/24/2023)     No current facility-administered medications for this visit.    OBJECTIVE: Vitals:   04/03/23 1024  BP: 122/76  Pulse:  76  Temp: 97.6 F (36.4 C)  SpO2: 100%     Body mass index is 32.49 kg/m.      General: Well-developed, well-nourished, no acute distress. Eyes: Pink conjunctiva, anicteric sclera. HEENT: Normocephalic, moist mucous membranes, clear oropharnyx. Lungs: Clear to auscultation bilaterally. Heart: Regular rate and rhythm. No rubs, murmurs, or gallops. Abdomen: Soft, nontender, nondistended. No organomegaly noted, normoactive bowel sounds. Musculoskeletal: No edema, cyanosis, or clubbing. Neuro: Alert, answering all questions appropriately. Cranial nerves grossly intact. Skin: No rashes or petechiae noted. Psych: Normal affect. Lymphatics: No cervical, calvicular, axillary or inguinal LAD.   LAB RESULTS:  No results found for: "NA", "K", "CL", "CO2", "GLUCOSE", "BUN", "CREATININE", "CALCIUM", "PROT", "ALBUMIN", "AST", "ALT", "ALKPHOS", "BILITOT", "GFRNONAA", "GFRAA"  Lab Results  Component Value Date   WBC 7.0 03/24/2023   NEUTROABS 3.1 03/24/2023   HGB 15.0 03/24/2023   HCT 47.5 03/24/2023   MCV 96.2 03/24/2023   PLT 209 03/24/2023    No results found for: "TIBC", "FERRITIN", "IRONPCTSAT"   STUDIES: No results found.  ASSESSMENT AND PLAN:   Ronald Duarte is a 77 y.o. male with pmh of GERD, ascending aortic aneurysm, bronchitis, hypertension,  pericarditis, stroke was referred to hematology for workup of eosinophilia.  # Eosinophilia -Chronic: Unclear etiology. On lab review, patient has elevated eosinophil count since 2019. In 2019, AEC was around 0.57.  From 12/2021 AEC 0.81 from 07/2022 AEC 1000.  From 12/2022 AEC 0.92 with mild basophilia of 0.12.  Hemoglobin and platelets are normal.  WBC count is normal.  Eosinophilia has been ranging between [phone].  -CBC with differential repeated on 03/24/2023 with WBC 7, eosinophils 12% with ANC of 800.  Normal hemoglobin and platelet.  -Vitamin B12 312, tryptase 10.2, BCR-ABL FISH negative, flow cytometry showed CD57 positive cell increased but composed of mixture of CD4 and CD8 positive T cells and NK cells.  No diagnostic immunophenotypic abnormality detected.  Eosinophils are increased accounting for 7%.  -Patient does report history of pruritus which has been worsened since 2019.  He is on Allegra and Singulair does not think it has been helping much.  Has been evaluated by allergist had a panel of testing done for environmental allergens which was negative.  Also reports was seen by dermatology and workup was negative.  No recent travels.  Medication list was reviewed with no offending agents identified.  I discussed with the patient that the initial workup is not showing any obvious abnormality.  Considering patient has eosinophilia for past 5 years and is maintained between [phone] suspicion for neoplastic process is low.  Discussed bone marrow biopsy is not urgently indicated.  There could be a reactive component from autoimmune conditions or allergic reaction.  At this point, I will make a referral to academic center benign hematology at [facility] for more thorough evaluation.  RTC as needed  Patient expressed understanding and was in agreement with this plan. He also understands that He can call clinic at any time with any questions, concerns, or complaints.   I spent a total of 25 minutes  reviewing chart data, face-to-face evaluation with the patient, counseling and coordination of care as detailed above.  Michaelyn Barter, MD   04/03/2023 12:26 PM

## 2023-04-03 NOTE — Progress Notes (Signed)
Patient is doing well and has no concerns today.

## 2023-04-11 ENCOUNTER — Telehealth: Payer: Self-pay

## 2023-04-11 NOTE — Telephone Encounter (Signed)
I just spoke with [facility] Benign Heme Oncology department; she told me that the patient, Ronald Duarte, has an appointment on 05/15/2023 at 8:50 am. They sent the patient the forms to fill out and bring with them to their visit.

## 2023-05-19 ENCOUNTER — Other Ambulatory Visit: Payer: Self-pay | Admitting: Internal Medicine

## 2023-05-19 DIAGNOSIS — I7121 Aneurysm of the ascending aorta, without rupture: Secondary | ICD-10-CM

## 2023-07-13 ENCOUNTER — Ambulatory Visit
Admission: RE | Admit: 2023-07-13 | Discharge: 2023-07-13 | Disposition: A | Discharge status: Home / Self Care | Payer: Medicare PPO | Source: Ambulatory Visit | Attending: Internal Medicine | Admitting: Internal Medicine

## 2023-07-13 DIAGNOSIS — I7121 Aneurysm of the ascending aorta, without rupture: Secondary | ICD-10-CM | POA: Diagnosis present

## 2023-07-13 LAB — POCT I-STAT CREATININE: Creatinine, Ser: 1.1 mg/dL (ref 0.61–1.24)

## 2023-07-13 MED ORDER — IOHEXOL 350 MG/ML SOLN
100.0000 mL | Freq: Once | INTRAVENOUS | Status: AC | PRN
  Administered 2023-07-13: 100 mL via INTRAVENOUS

## 2023-11-22 ENCOUNTER — Telehealth: Payer: Self-pay

## 2023-11-22 NOTE — Telephone Encounter (Signed)
 The patient wife [name]) called to schedule her husband repeat colonoscopy. Per Dr. Annabell Sabal recommendation, the patient is advised to have a repeat colonoscopy in 5 years to assess for the development of any new polyps and to screen for colorectal cancer. I transferred the call to the scheduler, Marcelino Duster, for further assistance.

## 2023-11-23 ENCOUNTER — Other Ambulatory Visit: Payer: Self-pay

## 2023-11-23 ENCOUNTER — Telehealth: Payer: Self-pay

## 2023-11-23 DIAGNOSIS — Z860100 Personal history of colon polyps, unspecified: Secondary | ICD-10-CM

## 2023-11-23 MED ORDER — NA SULFATE-K SULFATE-MG SULF 17.5-3.13-1.6 GM/177ML PO SOLN: 1.0000 | Freq: Once | ORAL | 0 refills | Status: AC

## 2023-11-23 NOTE — Telephone Encounter (Signed)
 Gastroenterology Pre-Procedure Review  Request Date: 12/19/23 Requesting Physician: Dr. Servando Snare  PATIENT REVIEW QUESTIONS: The patient responded to the following health history questions as indicated:    1. Are you having any GI issues? no 2. Do you have a personal history of Polyps? yes (last colonoscopy performed by Dr. Servando Snare 09/25/18 recommended repeat in 5 years) 3. Do you have a family history of Colon Cancer or Polyps? no 4. Diabetes Mellitus? yes (takes metformin has been advised to stop 2 days prior to colonoscopy) 5. Joint replacements in the past 12 months?no 6. Major health problems in the past 3 months?no 7. Any artificial heart valves, MVP, or defibrillator? Cardiac health  CAD and Thoracic ascending aortic aneurysm Cardiac Clearance sent to [facility]    MEDICATIONS & ALLERGIES:    Patient reports the following regarding taking any anticoagulation/antiplatelet therapy:   Plavix, Coumadin, Eliquis, Xarelto, Lovenox, Pradaxa, Brilinta, or Effient? Yes patient takes Xarelto stated Dr. Graciela Husbands prescribes.  Blood thinner advice faxed to Dr. Graciela Husbands. Aspirin? no  Patient confirms/reports the following medications:  Current Outpatient Medications  Medication Sig Dispense Refill   albuterol (PROVENTIL HFA;VENTOLIN HFA) 108 (90 Base) MCG/ACT inhaler Inhale 2 puffs into the lungs every 6 (six) hours as needed for wheezing or shortness of breath.     Biotin 40981 MCG TABS Take 10,000 mcg by mouth daily with supper.     cetirizine (ZYRTEC) 10 MG tablet Take 10 mg by mouth daily.      Coenzyme Q10 (COQ10) 200 MG CAPS Take 200 mg by mouth daily with supper.     diphenhydrAMINE (BENADRYL) 25 mg capsule Take 25 mg by mouth every 6 (six) hours as needed.     doxepin (SINEQUAN) 25 MG capsule Take by mouth.     dutasteride (AVODART) 0.5 MG capsule Take 0.5 mg by mouth at bedtime.     dutasteride (AVODART) 0.5 MG capsule Take by mouth.     esomeprazole (NEXIUM) 40 MG capsule Take 40 mg by mouth  daily.     ezetimibe (ZETIA) 10 MG tablet Take 10 mg by mouth at bedtime.     folic acid (FOLVITE) 400 MCG tablet Take 400 mcg by mouth daily with supper.     hydrOXYzine (ATARAX) 25 MG tablet Take 1 tablet (25 mg total) by mouth every 6 (six) hours. 12 tablet 0   ibuprofen (ADVIL,MOTRIN) 200 MG tablet Take 200 mg by mouth every 8 (eight) hours as needed (for pain (seldom)).     metFORMIN (GLUCOPHAGE) 500 MG tablet Take 500 mg by mouth 3 (three) times daily with meals.     metroNIDAZOLE (METROCREAM) 0.75 % cream Apply topically.     nebivolol (BYSTOLIC) 2.5 MG tablet Take 2.5 mg by mouth every evening.      pantoprazole (PROTONIX) 40 MG tablet Take 1 tablet (40 mg total) by mouth daily. 30 tablet 11   rivaroxaban (XARELTO) 20 MG TABS tablet Take 20 mg by mouth daily after supper.     rosuvastatin (CRESTOR) 20 MG tablet Take 20 mg by mouth at bedtime.     traZODone (DESYREL) 50 MG tablet Take 50 mg by mouth at bedtime. (Patient not taking: Reported on 03/24/2023)     triamcinolone cream (KENALOG) 0.1 % Apply 1 Application topically 2 (two) times daily. Avoid application near the groin and face. 454 g 0   No current facility-administered medications for this visit.    Patient confirms/reports the following allergies:  No Known Allergies  No orders of the  defined types were placed in this encounter.   AUTHORIZATION INFORMATION Primary Insurance: 1D#: Group #:  Secondary Insurance: 1D#: Group #:  SCHEDULE INFORMATION: Date: 12/19/23 Time: Location: [facility]

## 2023-11-28 NOTE — Telephone Encounter (Signed)
 Cardiac clearance has been granted by Dr. Darrold Junker per fax received 11/27/23.  Blood thinner advice has been received from PCP Dr. Graciela Husbands per fax received on 11/24/23.  Pts wife was informed on 11/27/23 her husband to stop Xarelto 3 days prior to colonoscopy and restart 1 day after.  A copy of blood thinner advice has been mailed to patient.  Thanks, [place], [place]

## 2023-12-18 ENCOUNTER — Encounter: Payer: Self-pay | Admitting: Gastroenterology

## 2023-12-19 ENCOUNTER — Encounter
Admission: RE | Disposition: A | Discharge status: Home / Self Care | Payer: Self-pay | Source: Home / Self Care | Attending: Gastroenterology

## 2023-12-19 ENCOUNTER — Ambulatory Visit: Admitting: Anesthesiology

## 2023-12-19 ENCOUNTER — Encounter: Payer: Self-pay | Admitting: Gastroenterology

## 2023-12-19 ENCOUNTER — Ambulatory Visit
Admission: RE | Admit: 2023-12-19 | Discharge: 2023-12-19 | Disposition: A | Discharge status: Home / Self Care | Attending: Gastroenterology | Admitting: Gastroenterology

## 2023-12-19 DIAGNOSIS — D123 Benign neoplasm of transverse colon: Secondary | ICD-10-CM | POA: Insufficient documentation

## 2023-12-19 DIAGNOSIS — K219 Gastro-esophageal reflux disease without esophagitis: Secondary | ICD-10-CM | POA: Diagnosis not present

## 2023-12-19 DIAGNOSIS — Z7901 Long term (current) use of anticoagulants: Secondary | ICD-10-CM | POA: Diagnosis not present

## 2023-12-19 DIAGNOSIS — Z1211 Encounter for screening for malignant neoplasm of colon: Secondary | ICD-10-CM | POA: Diagnosis not present

## 2023-12-19 DIAGNOSIS — N400 Enlarged prostate without lower urinary tract symptoms: Secondary | ICD-10-CM | POA: Insufficient documentation

## 2023-12-19 DIAGNOSIS — E669 Obesity, unspecified: Secondary | ICD-10-CM | POA: Diagnosis not present

## 2023-12-19 DIAGNOSIS — Z8673 Personal history of transient ischemic attack (TIA), and cerebral infarction without residual deficits: Secondary | ICD-10-CM | POA: Insufficient documentation

## 2023-12-19 DIAGNOSIS — E119 Type 2 diabetes mellitus without complications: Secondary | ICD-10-CM | POA: Insufficient documentation

## 2023-12-19 DIAGNOSIS — Z7984 Long term (current) use of oral hypoglycemic drugs: Secondary | ICD-10-CM | POA: Diagnosis not present

## 2023-12-19 DIAGNOSIS — K635 Polyp of colon: Secondary | ICD-10-CM

## 2023-12-19 DIAGNOSIS — Z8711 Personal history of peptic ulcer disease: Secondary | ICD-10-CM | POA: Insufficient documentation

## 2023-12-19 DIAGNOSIS — Z86718 Personal history of other venous thrombosis and embolism: Secondary | ICD-10-CM | POA: Insufficient documentation

## 2023-12-19 DIAGNOSIS — D122 Benign neoplasm of ascending colon: Secondary | ICD-10-CM | POA: Diagnosis not present

## 2023-12-19 DIAGNOSIS — K449 Diaphragmatic hernia without obstruction or gangrene: Secondary | ICD-10-CM | POA: Insufficient documentation

## 2023-12-19 DIAGNOSIS — I10 Essential (primary) hypertension: Secondary | ICD-10-CM | POA: Insufficient documentation

## 2023-12-19 DIAGNOSIS — Z860100 Personal history of colon polyps, unspecified: Secondary | ICD-10-CM

## 2023-12-19 HISTORY — DX: Other thrombophilia: D68.69

## 2023-12-19 HISTORY — PX: COLONOSCOPY: SHX5424

## 2023-12-19 LAB — GLUCOSE, CAPILLARY: Glucose-Capillary: 131 mg/dL — ABNORMAL HIGH (ref 70–99)

## 2023-12-19 SURGERY — COLONOSCOPY
Anesthesia: General

## 2023-12-19 MED ORDER — PROPOFOL 500 MG/50ML IV EMUL
INTRAVENOUS | Status: DC | PRN
  Administered 2023-12-19: 125 ug/kg/min via INTRAVENOUS

## 2023-12-19 MED ORDER — FENTANYL CITRATE (PF) 100 MCG/2ML IJ SOLN: INTRAMUSCULAR | Status: DC | PRN

## 2023-12-19 MED ORDER — LIDOCAINE HCL (CARDIAC) PF 100 MG/5ML IV SOSY
PREFILLED_SYRINGE | INTRAVENOUS | Status: DC | PRN
  Administered 2023-12-19: 40 mg via INTRAVENOUS

## 2023-12-19 MED ORDER — SODIUM CHLORIDE 0.9 % IV SOLN: INTRAVENOUS | Status: DC

## 2023-12-19 MED ORDER — PROPOFOL 10 MG/ML IV BOLUS
INTRAVENOUS | Status: DC | PRN
  Administered 2023-12-19: 30 mg via INTRAVENOUS
  Administered 2023-12-19: 20 mg via INTRAVENOUS

## 2023-12-19 NOTE — Anesthesia Preprocedure Evaluation (Addendum)
 Anesthesia Evaluation  Patient identified by MRN, date of birth, ID band Patient awake    Reviewed: Allergy & Precautions, NPO status , Patient's Chart, lab work & pertinent test results  History of Anesthesia Complications Negative for: history of anesthetic complications  Airway Mallampati: I   Neck ROM: Full    Dental no notable dental hx.    Pulmonary neg pulmonary ROS   Pulmonary exam normal breath sounds clear to auscultation       Cardiovascular hypertension, Normal cardiovascular exam Rhythm:Regular Rate:Normal  Hx DVT on Xarelto   Neuro/Psych HOH TIA Neuromuscular disease (neuropathy) CVA, No Residual Symptoms    GI/Hepatic hiatal hernia, PUD,GERD  ,,  Endo/Other  diabetes, Type 2  Obesity   Renal/GU negative Renal ROS   BPH    Musculoskeletal   Abdominal   Peds  Hematology negative hematology ROS (+)   Anesthesia Other Findings Cardiology note 11/20/23:  78 year old gentleman with recent history of occasional chest pain, with atypical features, with normal ECG, and normal Lexiscan Myoview without evidence for ischemia. The patient has undergone previous cardiac catheterizations 1989, and approximately 8 to 10 years ago which apparently did not reveal significant coronary artery disease. The patient has essential hypertension, blood pressure well controlled on current BP medications. Surveillance chest CT 08/08/2022 revealed dilated ascending aorta 4.2 cm with coronary and aortic atherosclerosis. Repeat CT 07/23/2023 revealed dilated sending thoracic aorta 4.1 cm.  Plan   1. Continue current medications 2. Counseled patient about low-cholesterol diet 3. Continue rosuvastatin for hyperlipidemia management 4. Low-fat and cholesterol diet printed instructions given to the patient 5. Diabetes diet printed instructions given to the patient 6. Return to clinic for follow-up in 6 months  No orders of the  defined types were placed in this encounter.  Return in about 6 months (around 05/22/2024).    Reproductive/Obstetrics                             Anesthesia Physical Anesthesia Plan  ASA: 3  Anesthesia Plan: General   Post-op Pain Management:    Induction: Intravenous  PONV Risk Score and Plan: 2 and Propofol  infusion, TIVA and Treatment may vary due to age or medical condition  Airway Management Planned: Natural Airway  Additional Equipment:   Intra-op Plan:   Post-operative Plan:   Informed Consent: I have reviewed the patients History and Physical, chart, labs and discussed the procedure including the risks, benefits and alternatives for the proposed anesthesia with the patient or authorized representative who has indicated his/her understanding and acceptance.       Plan Discussed with: CRNA  Anesthesia Plan Comments: (LMA/GETA backup discussed.  Patient consented for risks of anesthesia including but not limited to:  - adverse reactions to medications - damage to eyes, teeth, lips or other oral mucosa - nerve damage due to positioning  - sore throat or hoarseness - damage to heart, brain, nerves, lungs, other parts of body or loss of life  Informed patient about role of CRNA in peri- and intra-operative care.  Patient voiced understanding.)        Anesthesia Quick Evaluation

## 2023-12-19 NOTE — Anesthesia Postprocedure Evaluation (Signed)
 Anesthesia Post Note  Patient: Ronald Duarte  Procedure(s) Performed: COLONOSCOPY  Patient location during evaluation: PACU Anesthesia Type: General Level of consciousness: awake and alert, oriented and patient cooperative Pain management: pain level controlled Vital Signs Assessment: post-procedure vital signs reviewed and stable Respiratory status: spontaneous breathing, nonlabored ventilation and respiratory function stable Cardiovascular status: blood pressure returned to baseline and stable Postop Assessment: adequate PO intake Anesthetic complications: no   No notable events documented.   Last Vitals:  Vitals:   12/19/23 0942 12/19/23 0943  BP: 137/79 137/79  Pulse: (!) 58 (!) 58  Resp: 15 19  Temp:    SpO2: 96% 97%    Last Pain:  Vitals:   12/19/23 0943  TempSrc:   PainSc: 0-No pain                 Dorothey Gate

## 2023-12-19 NOTE — Op Note (Signed)
 [facility] Gastroenterology Patient Name: Ronald Duarte Procedure Date: 12/19/2023 8:57 AM MRN: [id number] Account #: [id number] Date of Birth: 1946-02-17 Admit Type: Outpatient Age: 78 Room: [facility] ENDO ROOM 4 Gender: Male Note Status: Finalized Instrument Name: Hyman Main [id number] Procedure:             Colonoscopy Indications:           High risk colon cancer surveillance: Personal history                         of colonic polyps Providers:             Marnee Sink MD, MD Referring MD:          No Local Md, MD (Referring MD) Medicines:             Propofol  per Anesthesia Complications:         No immediate complications. Procedure:             Pre-Anesthesia Assessment:                        - Prior to the procedure, a History and Physical was                         performed, and patient medications and allergies were                         reviewed. The patient's tolerance of previous                         anesthesia was also reviewed. The risks and benefits                         of the procedure and the sedation options and risks                         were discussed with the patient. All questions were                         answered, and informed consent was obtained. Prior                         Anticoagulants: The patient has taken no anticoagulant                         or antiplatelet agents. ASA Grade Assessment: II - A                         patient with mild systemic disease. After reviewing                         the risks and benefits, the patient was deemed in                         satisfactory condition to undergo the procedure.                        After obtaining informed consent, the colonoscope was  passed under direct vision. Throughout the procedure,                         the patient's blood pressure, pulse, and oxygen                         saturations were monitored continuously. The                          Colonoscope was introduced through the anus and                         advanced to the the cecum, identified by appendiceal                         orifice and ileocecal valve. The colonoscopy was                         performed without difficulty. The patient tolerated                         the procedure well. The quality of the bowel                         preparation was excellent. Findings:      The perianal and digital rectal examinations were normal.      A 3 mm polyp was found in the ascending colon. The polyp was sessile.       The polyp was removed with a cold biopsy forceps. Resection and       retrieval were complete.      A 3 mm polyp was found in the transverse colon. The polyp was sessile.       The polyp was removed with a cold biopsy forceps. Resection and       retrieval were complete. Impression:            - One 3 mm polyp in the ascending colon, removed with                         a cold biopsy forceps. Resected and retrieved.                        - One 3 mm polyp in the transverse colon, removed with                         a cold biopsy forceps. Resected and retrieved. Recommendation:        - Discharge patient to home.                        - Resume previous diet.                        - Continue present medications.                        - Await pathology results.                        - Repeat colonoscopy is not recommended for  surveillance. Procedure Code(s):     --- Professional ---                        [phone], Colonoscopy, flexible; with biopsy, single or                         multiple Diagnosis Code(s):     --- Professional ---                        Z86.010, Personal history of colonic polyps                        D12.3, Benign neoplasm of transverse colon (hepatic                         flexure or splenic flexure) CPT copyright 2022 American Medical Association. All rights reserved. The codes  documented in this report are preliminary and upon coder review may  be revised to meet current compliance requirements. Marnee Sink MD, MD 12/19/2023 9:19:27 AM This report has been signed electronically. Number of Addenda: 0 Note Initiated On: 12/19/2023 8:57 AM Scope Withdrawal Time: 0 hours 9 minutes 47 seconds  Total Procedure Duration: 0 hours 12 minutes 49 seconds  Estimated Blood Loss:  Estimated blood loss: none.      [facility]

## 2023-12-19 NOTE — H&P (Signed)
 Marnee Sink, MD [facility] [address], [address] [place], [place] [address] [phone] Fax : [phone]  Primary Care Physician:  Melchor Spoon, MD Primary Gastroenterologist:  Dr. Ole Berkeley  Pre-Procedure History & Physical: HPI:  Ronald Duarte is a 78 y.o. male is here for an colonoscopy.   Past Medical History:  Diagnosis Date   Acquired thrombophilia (HCC)    Ascending aortic aneurysm (HCC)    BPH (benign prostatic hyperplasia)    Bronchitis    Diabetes mellitus without complication (HCC)    DVT (deep venous thrombosis) (HCC)    H/O LEFT LEG   Elevated lipids    GERD (gastroesophageal reflux disease)    History of hiatal hernia    HOH (hard of hearing)    Hypertension    Lower extremity edema    Multiple gastric ulcers    Neuropathy    MILD IN RIGHT LEG   Pericarditis 1989   Stroke [facility])    TIA 1987   TIA (transient ischemic attack)     Past Surgical History:  Procedure Laterality Date   CARDIAC CATHETERIZATION     X 2   CATARACT EXTRACTION W/PHACO Right 06/28/2016   Procedure: CATARACT EXTRACTION PHACO AND INTRAOCULAR LENS PLACEMENT (IOC);  Surgeon: Clair Crews, MD;  Location: [facility] ORS;  Service: Ophthalmology;  Laterality: Right;  US  1.02 AP% 21.7 CDE 13.51 Fluid pack lot # [id number]   CATARACT EXTRACTION W/PHACO Left 02/14/2017   Procedure: CATARACT EXTRACTION PHACO AND INTRAOCULAR LENS PLACEMENT (IOC);  Surgeon: Clair Crews, MD;  Location: [facility] ORS;  Service: Ophthalmology;  Laterality: Left;  US  00:44 AP% 11.0  CDE 4.82 Fluid pack lot # [id number]   COLONOSCOPY WITH PROPOFOL  N/A 09/25/2018   Procedure: COLONOSCOPY WITH PROPOFOL ;  Surgeon: Marnee Sink, MD;  Location: [facility] ENDOSCOPY;  Service: Endoscopy;  Laterality: N/A;   ESOPHAGOGASTRODUODENOSCOPY (EGD) WITH PROPOFOL  N/A 09/25/2018   Procedure: ESOPHAGOGASTRODUODENOSCOPY (EGD) WITH PROPOFOL ;  Surgeon: Marnee Sink, MD;  Location: [facility] ENDOSCOPY;  Service: Endoscopy;  Laterality: N/A;    KNEE ARTHROSCOPY  2014/2016   toenail removed      Prior to Admission medications   Medication Sig Start Date End Date Taking? Authorizing Provider  albuterol (PROVENTIL HFA;VENTOLIN HFA) 108 (90 Base) MCG/ACT inhaler Inhale 2 puffs into the lungs every 6 (six) hours as needed for wheezing or shortness of breath.   Yes [provider]  Biotin 59563 MCG TABS Take 10,000 mcg by mouth daily with supper.   Yes [provider]  Coenzyme Q10 (COQ10) 200 MG CAPS Take 200 mg by mouth daily with supper.   Yes [provider]  doxepin (SINEQUAN) 25 MG capsule Take by mouth. 03/17/23 03/16/24 Yes [provider]  dutasteride (AVODART) 0.5 MG capsule Take 0.5 mg by mouth at bedtime.   Yes [provider]  esomeprazole (NEXIUM) 40 MG capsule Take 40 mg by mouth daily.   Yes [provider]  ezetimibe (ZETIA) 10 MG tablet Take 10 mg by mouth at bedtime.   Yes [provider]  folic acid (FOLVITE) 400 MCG tablet Take 400 mcg by mouth daily with supper.   Yes [provider]  ibuprofen (ADVIL,MOTRIN) 200 MG tablet Take 200 mg by mouth every 8 (eight) hours as needed (for pain (seldom)).   Yes [provider]  metFORMIN (GLUCOPHAGE) 500 MG tablet Take 500 mg by mouth 3 (three) times daily with meals.   Yes [provider]  cetirizine (ZYRTEC) 10 MG tablet Take 10  mg by mouth daily.     [provider]  diphenhydrAMINE (BENADRYL) 25 mg capsule Take 25 mg by mouth every 6 (six) hours as needed.    [provider]  dutasteride (AVODART) 0.5 MG capsule Take by mouth. 02/06/23   [provider]  hydrOXYzine  (ATARAX ) 25 MG tablet Take 1 tablet (25 mg total) by mouth every 6 (six) hours. Patient not taking: Reported on 12/19/2023 03/04/23   Immordino, Mara Seminole, FNP  metroNIDAZOLE (METROCREAM) 0.75 % cream Apply topically. 02/13/23   [provider]  nebivolol (BYSTOLIC) 2.5 MG tablet Take 2.5 mg by  mouth every evening.     [provider]  pantoprazole  (PROTONIX ) 40 MG tablet Take 1 tablet (40 mg total) by mouth daily. Patient not taking: Reported on 12/19/2023 09/25/18   Marnee Sink, MD  peginterferon alfa-2a (PEGASYS) 180 MCG/ML injection Inject into the skin. Patient not taking: Reported on 12/19/2023 08/21/23   [provider]  rivaroxaban (XARELTO) 20 MG TABS tablet Take 20 mg by mouth daily after supper.    [provider]  rosuvastatin (CRESTOR) 20 MG tablet Take 20 mg by mouth at bedtime.    [provider]  ruxolitinib phosphate (JAKAFI) 10 MG tablet Take by mouth. Patient not taking: Reported on 12/19/2023 09/20/23   [provider]  traZODone (DESYREL) 50 MG tablet Take 50 mg by mouth at bedtime. Patient not taking: Reported on 03/24/2023    [provider]  triamcinolone  cream (KENALOG ) 0.1 % Apply 1 Application topically 2 (two) times daily. Avoid application near the groin and face. 03/04/23   Immordino, Mara Seminole, FNP    Allergies as of 11/23/2023   (No Known Allergies)    Family History  Problem Relation Age of Onset   Prostate cancer Neg Hx    Hematuria Neg Hx    Sickle cell anemia Neg Hx    Tuberculosis Neg Hx    Bladder Cancer Neg Hx    Kidney cancer Neg Hx     Social History   Socioeconomic History   Marital status: Married    Spouse name: Not on file   Number of children: Not on file   Years of education: Not on file   Highest education level: Not on file  Occupational History   Not on file  Tobacco Use   Smoking status: Never   Smokeless tobacco: Never  Vaping Use   Vaping status: Never Used  Substance and Sexual Activity   Alcohol use: Yes    Alcohol/week: 1.0 standard drink of alcohol    Types: 1 Glasses of wine per week   Drug use: No   Sexual activity: Not on file  Other Topics Concern   Not on file  Social History Narrative   Not on file   Social Drivers of Health   Financial Resource  Strain: Low Risk  (01/19/2023)   Received from [facility]   Overall Financial Resource Strain (CARDIA)    Difficulty of Paying Living Expenses: Not hard at all  Food Insecurity: No Food Insecurity (03/24/2023)   Hunger Vital Sign    Worried About Running Out of Food in the Last Year: Never true    Ran Out of Food in the Last Year: Never true  Transportation Needs: No Transportation Needs (01/19/2023)   Received from [facility] - Transportation    In the past 12 months, has lack of transportation kept you from medical appointments or from getting  medications?: No    Lack of Transportation (Non-Medical): No  Physical Activity: Not on file  Stress: Not on file  Social Connections: Not on file  Intimate Partner Violence: Not At Risk (03/24/2023)   Humiliation, Afraid, Rape, and Kick questionnaire    Fear of Current or Ex-Partner: No    Emotionally Abused: No    Physically Abused: No    Sexually Abused: No    Review of Systems: See HPI, otherwise negative ROS  Physical Exam: There were no vitals taken for this visit. General:   Alert,  pleasant and cooperative in NAD Head:  Normocephalic and atraumatic. Neck:  Supple; no masses or thyromegaly. Lungs:  Clear throughout to auscultation.    Heart:  Regular rate and rhythm. Abdomen:  Soft, nontender and nondistended. Normal bowel sounds, without guarding, and without rebound.   Neurologic:  Alert and  oriented x4;  grossly normal neurologically.  Impression/Plan: Ronald Duarte is here for an colonoscopy to be performed for a history of adenomatous polyps on 2020   Risks, benefits, limitations, and alternatives regarding  colonoscopy have been reviewed with the patient.  Questions have been answered.  All parties agreeable.   Marnee Sink, MD  12/19/2023, 8:22 AM

## 2023-12-19 NOTE — Transfer of Care (Signed)
 Immediate Anesthesia Transfer of Care Note  Patient: Ronald Duarte  Procedure(s) Performed: COLONOSCOPY  Patient Location: PACU  Anesthesia Type:General  Level of Consciousness: awake, alert , oriented, and patient cooperative  Airway & Oxygen Therapy: Patient Spontanous Breathing and Patient connected to nasal cannula oxygen  Post-op Assessment: Report given to RN, Post -op Vital signs reviewed and stable, and Patient moving all extremities X 4  Post vital signs: Reviewed and stable  Last Vitals:  Vitals Value Taken Time  BP 102/65 12/19/23 0923  Temp 36.6 C 12/19/23 0922  Pulse 75 12/19/23 0923  Resp 21 12/19/23 0923  SpO2 98 % 12/19/23 0923  Vitals shown include unfiled device data.  Last Pain:  Vitals:   12/19/23 0823  TempSrc: Temporal       Patent airway to PACU, breathing spontaneously on RA. Pt awake, responsive and comfortable. VSS.  Complications: No notable events documented.

## 2023-12-21 ENCOUNTER — Encounter: Payer: Self-pay | Admitting: Gastroenterology

## 2023-12-21 LAB — SURGICAL PATHOLOGY

## 2024-02-02 ENCOUNTER — Encounter: Payer: Self-pay | Admitting: Internal Medicine

## 2024-02-02 ENCOUNTER — Other Ambulatory Visit: Payer: Self-pay | Admitting: Internal Medicine

## 2024-02-02 DIAGNOSIS — N5089 Other specified disorders of the male genital organs: Secondary | ICD-10-CM

## 2024-02-07 ENCOUNTER — Ambulatory Visit
Admission: RE | Admit: 2024-02-07 | Discharge: 2024-02-07 | Disposition: A | Discharge status: Home / Self Care | Source: Ambulatory Visit | Attending: Internal Medicine | Admitting: Internal Medicine

## 2024-02-07 DIAGNOSIS — N5089 Other specified disorders of the male genital organs: Secondary | ICD-10-CM | POA: Diagnosis present
